# Patient Record
Sex: Female | Born: 1980 | Race: Black or African American | Hispanic: No | Marital: Single | Smoking: Never smoker
Health system: Southern US, Community
[De-identification: ages and names within clinical notes are randomized; demographics above are authoritative.]

## PROBLEM LIST (undated history)

## (undated) DIAGNOSIS — G35 Multiple sclerosis: Secondary | ICD-10-CM

## (undated) HISTORY — PX: TONSILLECTOMY: SUR1361

---

## 2002-07-28 ENCOUNTER — Emergency Department (HOSPITAL_COMMUNITY): Admission: EM | Admit: 2002-07-28 | Discharge: 2002-07-28 | Payer: Self-pay

## 2005-01-07 ENCOUNTER — Ambulatory Visit: Payer: Self-pay | Admitting: *Deleted

## 2005-01-11 ENCOUNTER — Ambulatory Visit: Payer: Self-pay | Admitting: Family Medicine

## 2005-01-11 ENCOUNTER — Inpatient Hospital Stay (HOSPITAL_COMMUNITY): Admission: AD | Admit: 2005-01-11 | Discharge: 2005-01-12 | Payer: Self-pay | Admitting: *Deleted

## 2005-01-14 ENCOUNTER — Inpatient Hospital Stay (HOSPITAL_COMMUNITY): Admission: AD | Admit: 2005-01-14 | Discharge: 2005-01-15 | Payer: Self-pay | Admitting: Obstetrics & Gynecology

## 2005-01-14 ENCOUNTER — Ambulatory Visit: Payer: Self-pay | Admitting: *Deleted

## 2005-01-17 ENCOUNTER — Ambulatory Visit: Payer: Self-pay | Admitting: *Deleted

## 2005-01-18 ENCOUNTER — Inpatient Hospital Stay (HOSPITAL_COMMUNITY): Admission: AD | Admit: 2005-01-18 | Discharge: 2005-01-20 | Payer: Self-pay | Admitting: Obstetrics & Gynecology

## 2005-01-18 ENCOUNTER — Ambulatory Visit: Payer: Self-pay | Admitting: Obstetrics and Gynecology

## 2005-03-25 ENCOUNTER — Other Ambulatory Visit: Admission: RE | Admit: 2005-03-25 | Discharge: 2005-03-25 | Payer: Self-pay | Admitting: Obstetrics and Gynecology

## 2005-08-18 ENCOUNTER — Emergency Department (HOSPITAL_COMMUNITY): Admission: EM | Admit: 2005-08-18 | Discharge: 2005-08-18 | Payer: Self-pay | Admitting: Emergency Medicine

## 2005-09-28 ENCOUNTER — Emergency Department (HOSPITAL_COMMUNITY): Admission: EM | Admit: 2005-09-28 | Discharge: 2005-09-28 | Payer: Self-pay | Admitting: Emergency Medicine

## 2007-02-15 ENCOUNTER — Other Ambulatory Visit: Admission: RE | Admit: 2007-02-15 | Discharge: 2007-02-15 | Payer: Self-pay | Admitting: Obstetrics and Gynecology

## 2007-07-02 ENCOUNTER — Emergency Department (HOSPITAL_COMMUNITY): Admission: EM | Admit: 2007-07-02 | Discharge: 2007-07-02 | Payer: Self-pay | Admitting: Emergency Medicine

## 2007-12-31 ENCOUNTER — Inpatient Hospital Stay (HOSPITAL_COMMUNITY): Admission: RE | Admit: 2007-12-31 | Discharge: 2008-01-03 | Payer: Self-pay | Admitting: *Deleted

## 2007-12-31 ENCOUNTER — Emergency Department (HOSPITAL_COMMUNITY): Admission: EM | Admit: 2007-12-31 | Discharge: 2007-12-31 | Payer: Self-pay | Admitting: Emergency Medicine

## 2007-12-31 ENCOUNTER — Ambulatory Visit: Payer: Self-pay | Admitting: *Deleted

## 2008-01-10 ENCOUNTER — Emergency Department (HOSPITAL_COMMUNITY): Admission: EM | Admit: 2008-01-10 | Discharge: 2008-01-10 | Payer: Self-pay | Admitting: Emergency Medicine

## 2008-07-21 ENCOUNTER — Emergency Department (HOSPITAL_COMMUNITY): Admission: EM | Admit: 2008-07-21 | Discharge: 2008-07-21 | Payer: Self-pay | Admitting: Emergency Medicine

## 2008-08-28 ENCOUNTER — Emergency Department (HOSPITAL_COMMUNITY): Admission: EM | Admit: 2008-08-28 | Discharge: 2008-08-28 | Payer: Self-pay | Admitting: Emergency Medicine

## 2010-08-06 LAB — CBC
HCT: 37.3 % (ref 36.0–46.0)
Hemoglobin: 12.7 g/dL (ref 12.0–15.0)
MCV: 95.1 fL (ref 78.0–100.0)
Platelets: 278 10*3/uL (ref 150–400)
RDW: 12.8 % (ref 11.5–15.5)

## 2010-08-06 LAB — URINALYSIS, ROUTINE W REFLEX MICROSCOPIC
Bilirubin Urine: NEGATIVE
Glucose, UA: NEGATIVE mg/dL
Ketones, ur: NEGATIVE mg/dL
Protein, ur: NEGATIVE mg/dL
Urobilinogen, UA: 1 mg/dL (ref 0.0–1.0)

## 2010-08-06 LAB — RPR: RPR Ser Ql: NONREACTIVE

## 2010-08-06 LAB — URINE MICROSCOPIC-ADD ON

## 2010-08-06 LAB — GC/CHLAMYDIA PROBE AMP, GENITAL
Chlamydia, DNA Probe: NEGATIVE
GC Probe Amp, Genital: NEGATIVE

## 2010-08-06 LAB — WET PREP, GENITAL
WBC, Wet Prep HPF POC: NONE SEEN
Yeast Wet Prep HPF POC: NONE SEEN

## 2010-08-06 LAB — POCT PREGNANCY, URINE: Preg Test, Ur: NEGATIVE

## 2010-08-08 LAB — GC/CHLAMYDIA PROBE AMP, GENITAL
Chlamydia, DNA Probe: NEGATIVE
GC Probe Amp, Genital: NEGATIVE

## 2010-08-08 LAB — URINALYSIS, ROUTINE W REFLEX MICROSCOPIC
Bilirubin Urine: NEGATIVE
Hgb urine dipstick: NEGATIVE
Ketones, ur: NEGATIVE mg/dL
Nitrite: NEGATIVE
Specific Gravity, Urine: 1.027 (ref 1.005–1.030)
Urobilinogen, UA: 1 mg/dL (ref 0.0–1.0)

## 2010-08-08 LAB — DIFFERENTIAL
Basophils Absolute: 0 10*3/uL (ref 0.0–0.1)
Basophils Relative: 0 % (ref 0–1)
Lymphocytes Relative: 21 % (ref 12–46)
Neutro Abs: 5.9 10*3/uL (ref 1.7–7.7)
Neutrophils Relative %: 71 % (ref 43–77)

## 2010-08-08 LAB — POCT I-STAT, CHEM 8
Calcium, Ion: 1.21 mmol/L (ref 1.12–1.32)
Creatinine, Ser: 0.9 mg/dL (ref 0.4–1.2)
Glucose, Bld: 80 mg/dL (ref 70–99)
Hemoglobin: 12.2 g/dL (ref 12.0–15.0)
TCO2: 24 mmol/L (ref 0–100)

## 2010-08-08 LAB — TYPE AND SCREEN
ABO/RH(D): B POS
Antibody Screen: NEGATIVE

## 2010-08-08 LAB — CBC
MCHC: 34 g/dL (ref 30.0–36.0)
RBC: 3.72 MIL/uL — ABNORMAL LOW (ref 3.87–5.11)
RDW: 12.8 % (ref 11.5–15.5)

## 2010-08-08 LAB — WET PREP, GENITAL
Trich, Wet Prep: NONE SEEN
Yeast Wet Prep HPF POC: NONE SEEN

## 2010-09-10 NOTE — H&P (Signed)
NAME:  Rita Caldwell, Rita Caldwell                    ACCOUNT NO.:  [id number]   MEDICAL RECORD NO.:  [id number]          PATIENT TYPE:  IPS   LOCATION:  0305                          FACILITY:  [facility]   PHYSICIAN:  Jasmine Pang, M.D. DATE OF BIRTH:  16-Jul-1980   DATE OF ADMISSION:  12/31/2007  DATE OF DISCHARGE:                       PSYCHIATRIC ADMISSION ASSESSMENT   This is a 30 year old married African American female who presented to  the emergency room yesterday reporting that she was depressed, that she  had been feeling suicidal.  She stated that she had tried to kill  herself by attempting to overdose on Unisom sleeping pills on September  3 and September 4.  She states that her intent was to kill herself.  She  reports that she feels hopeless and is having marital problems with her  spouse who threatened to leave her if she had her baby that she aborted  2 weeks ago.  She reports decreased sleep and appetite and she also  reports a history for undiagnosed postpartum depression in the past.  She did not take any medication after that.  This was from the birth of  her son who is now almost 3.  The patient reports that after the birth  of her son she became depressed.  They have been having marital problems  basically the whole time since their son has been born.  A couple of  months ago she rekindled an acquaintanceship with someone but she has  broken that off.  She and her husband tried to reconcile and she became  pregnant again.  Her husband stated that if she did not abort this fetus  he would leave.  He stated he just could not stand to be around her  after the baby was born and have another downward spiral.  The patient  reports that this is actually her second abortion for her husband.  The  first was when her son was 10 months ago.  She reports a lot of drama in  her household.  She would like to try to reconcile the marriage or if  that is not possible at this time at least work out a  reasonable  separation.  Apparently the lease on their apartment is up next month  and she states that if she could stay with uncles, grandparents or her  brothers in [place] if she cannot work out something with her  husband.   PAST PSYCHIATRIC HISTORY:  She stated she went to therapy for a few  weeks last November with someone named Robyn Haber.   SOCIAL HISTORY:  She has been married 5 years.  Her son is almost 3.  Her husband was diagnosed with sarcoidosis back in June. His family does  not feel that she works enough.   FAMILY HISTORY:  Negative.   ALCOHOL AND DRUG HISTORY:  Her UDS is positive for marijuana.  She  states she is only an occasional user and the last time was a few weeks  ago when they were in [place].   PRIMARY CARE PHYSICIAN:  Dr. Darcel Bayley.  She does not have any psychiatric  care.   MEDICAL PROBLEMS:  None are known.   MEDICATIONS:  None are prescribed.   DRUG ALLERGIES:  CODEINE and PENICILLIN.   PHYSICAL EXAMINATION:  She was medically cleared in the ED at [facility].  She had no remarkable findings.  Her vital signs on admission  show she is 67 inches tall, weighs 135.  Her temperature is 97.1, blood  pressure is 118/77 to 121/85.  Pulse is 72-76, respirations are 18.   MENTAL STATUS EXAM:  Today she is alert and oriented.  She was  appropriately groomed, dressed and nourished.  Her speech is not  pressured.  Her mood is appropriate to the situation.  Her affect is  within normal range.  Judgment and insight are intact.  Concentration  and memory intact.  Intelligence is at least average.  She denies being  suicidal.  She was never homicidal.  She denies auditory or visual  hallucinations.   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  AXIS II:  Rule out personality disorder.  AXIS III:  Unknown.  AXIS IV:  Severe, problems with primary support group, occupation and  Nurse, children's.  AXIS V:  35.   PLAN:  Admit for safety and  stabilization.  She will attend milieu  therapy, groups, etc.  She will be on the blue team.  We will have the  counselor set up a family session with her husband and whoever else she  feels is appropriate to help plan her discharge.  Estimated length of  stay is 3-5 days.      Mickie Leonarda Salon, P.A.-C.      Jasmine Pang, M.D.  Electronically Signed    MD/MEDQ  D:  01/01/2008  T:  01/02/2008  Job:  [id number]
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
## 2011-01-29 LAB — CBC
HCT: 35.9 — ABNORMAL LOW
MCHC: 34.4
MCV: 92.3
RBC: 3.89
WBC: 7

## 2011-01-29 LAB — TSH: TSH: 2.287

## 2011-01-29 LAB — RAPID URINE DRUG SCREEN, HOSP PERFORMED
Barbiturates: NOT DETECTED
Cocaine: NOT DETECTED

## 2011-01-29 LAB — DIFFERENTIAL
Basophils Absolute: 0
Lymphocytes Relative: 33
Lymphs Abs: 2.3
Neutro Abs: 4.4
Neutrophils Relative %: 62

## 2011-01-29 LAB — COMPREHENSIVE METABOLIC PANEL
AST: 26
BUN: 9
CO2: 25
Calcium: 9.5
Chloride: 107
Creatinine, Ser: 0.91
GFR calc Af Amer: >60
GFR calc non Af Amer: >60
Total Bilirubin: 0.7

## 2011-01-29 LAB — POCT I-STAT, CHEM 8
BUN: 10
Calcium, Ion: 1.19
Chloride: 106
HCT: 39
Potassium: 3.6
Sodium: 140

## 2011-01-29 LAB — SALICYLATE LEVEL: Salicylate Lvl: <4

## 2011-01-29 LAB — URINALYSIS, ROUTINE W REFLEX MICROSCOPIC
Glucose, UA: NEGATIVE
Ketones, ur: 40 — AB
Nitrite: NEGATIVE
Specific Gravity, Urine: 1.025
pH: 5.5

## 2012-08-02 ENCOUNTER — Emergency Department (HOSPITAL_COMMUNITY)
Admission: EM | Admit: 2012-08-02 | Discharge: 2012-08-02 | Disposition: A | Discharge status: Home / Self Care | Payer: Self-pay | Attending: Emergency Medicine | Admitting: Emergency Medicine

## 2012-08-02 ENCOUNTER — Encounter (HOSPITAL_COMMUNITY): Payer: Self-pay | Admitting: Emergency Medicine

## 2012-08-02 DIAGNOSIS — K0889 Other specified disorders of teeth and supporting structures: Secondary | ICD-10-CM

## 2012-08-02 DIAGNOSIS — K089 Disorder of teeth and supporting structures, unspecified: Secondary | ICD-10-CM | POA: Insufficient documentation

## 2012-08-02 DIAGNOSIS — K029 Dental caries, unspecified: Secondary | ICD-10-CM | POA: Insufficient documentation

## 2012-08-02 MED ORDER — OXYCODONE-ACETAMINOPHEN 5-325 MG PO TABS: 1.0000 | ORAL_TABLET | ORAL | Status: DC | PRN

## 2012-08-02 MED ORDER — AMOXICILLIN 500 MG PO CAPS: 500.0000 mg | ORAL_CAPSULE | Freq: Three times a day (TID) | ORAL | Status: DC

## 2012-08-02 NOTE — ED Notes (Signed)
MD at bedside. 

## 2012-08-02 NOTE — ED Provider Notes (Signed)
History     CSN: [id number]  Arrival date & time 08/02/12  1020   First MD Initiated Contact with Patient 08/02/12 1034      Chief Complaint  Patient presents with  . Dental Pain    (Consider location/radiation/quality/duration/timing/severity/associated sxs/prior treatment) HPI  Patient presents to the emergency department with a dental complaint. Symptoms began 4 days ago. The patient has tried to alleviate pain with hot pack, OTC pain medication.  Pain rated at a 10/10, characterized as throbbing in nature and located left lower molar. Patient denies fever, night sweats, chills, difficulty swallowing or opening mouth, SOB, nuchal rigidity or decreased ROM of neck.  Patient does not have a dentist and a referral to d/c.  History reviewed. No pertinent past medical history.  History reviewed. No pertinent past surgical history.  No family history on file.  History  Substance Use Topics  . Smoking status: Not on file  . Smokeless tobacco: Not on file  . Alcohol Use: Not on file    OB History   Grav Para Term Preterm Abortions TAB SAB Ect Mult Living                  Review of Systems  HENT: Positive for dental problem.   All other systems reviewed and are negative.    Allergies  Pineapple  Home Medications   Current Outpatient Rx  Name  Route  Sig  Dispense  Refill  . ibuprofen (ADVIL,MOTRIN) 200 MG tablet   Oral   Take 200 mg by mouth every 6 (six) hours as needed for pain.         Marland Kitchen amoxicillin (AMOXIL) 500 MG capsule   Oral   Take 1 capsule (500 mg total) by mouth 3 (three) times daily.   21 capsule   0   . oxyCODONE-acetaminophen (PERCOCET/ROXICET) 5-325 MG per tablet   Oral   Take 1 tablet by mouth every 4 (four) hours as needed for pain.   12 tablet   0     BP 122/73  Pulse 73  Temp(Src) 98.2 F (36.8 C) (Oral)  Resp 16  SpO2 98%  Physical Exam  Constitutional: She appears well-developed and well-nourished. No distress.  HENT:    Head: Normocephalic and atraumatic.  Mouth/Throat: Uvula is midline, oropharynx is clear and moist and mucous membranes are normal. Normal dentition. Dental caries (Pts tooth shows no obvious abscess but moderate to severe tenderness to palpation of marked tooth) present. No edematous.    Eyes: Pupils are equal, round, and reactive to light.  Neck: Trachea normal, normal range of motion and full passive range of motion without pain. Neck supple.  Cardiovascular: Normal rate, regular rhythm, normal heart sounds and normal pulses.   Pulmonary/Chest: Effort normal and breath sounds normal. No respiratory distress. Chest wall is not dull to percussion. She exhibits no tenderness, no crepitus, no edema, no deformity and no retraction.  Abdominal: Normal appearance.  Musculoskeletal: Normal range of motion.  Neurological: She is alert. She has normal strength.  Skin: Skin is warm, dry and intact. She is not diaphoretic.  Psychiatric: She has a normal mood and affect. Her speech is normal. Cognition and memory are normal.    ED Course  Procedures (including critical care time)  Labs Reviewed - No data to display No results found.   1. Toothache       MDM  Pt given Rx for pain and abx Patient given referral to dentist. Expressed that a dentist  must be seen or else this pain willo come back. I also told her that if she calls within 24-48 hours the dentist and her should be able to work out a payment plan and that she really needs to call.  Pt has been advised of the symptoms that warrant their return to the ED. Patient has voiced understanding and has agreed to follow-up with the PCP or specialist.          Dorthula Matas, PA-C 08/02/12 1111

## 2012-08-02 NOTE — ED Notes (Signed)
Lt side tooth pain since fri.

## 2012-08-04 NOTE — ED Provider Notes (Signed)
Medical screening examination/treatment/procedure(s) were performed by non-physician practitioner and as supervising physician I was immediately available for consultation/collaboration.   Balthazar Dooly E Connery Shiffler, MD 08/04/12 2150 

## 2013-12-23 ENCOUNTER — Encounter (HOSPITAL_COMMUNITY): Payer: Self-pay | Admitting: Emergency Medicine

## 2013-12-23 ENCOUNTER — Emergency Department (HOSPITAL_COMMUNITY)
Admission: EM | Admit: 2013-12-23 | Discharge: 2013-12-24 | Disposition: A | Discharge status: Home / Self Care | Payer: Self-pay | Attending: Emergency Medicine | Admitting: Emergency Medicine

## 2013-12-23 DIAGNOSIS — S239XXA Sprain of unspecified parts of thorax, initial encounter: Secondary | ICD-10-CM | POA: Insufficient documentation

## 2013-12-23 DIAGNOSIS — S199XXA Unspecified injury of neck, initial encounter: Secondary | ICD-10-CM

## 2013-12-23 DIAGNOSIS — M79671 Pain in right foot: Secondary | ICD-10-CM

## 2013-12-23 DIAGNOSIS — S8990XA Unspecified injury of unspecified lower leg, initial encounter: Secondary | ICD-10-CM | POA: Insufficient documentation

## 2013-12-23 DIAGNOSIS — S8001XA Contusion of right knee, initial encounter: Secondary | ICD-10-CM

## 2013-12-23 DIAGNOSIS — S0993XA Unspecified injury of face, initial encounter: Secondary | ICD-10-CM | POA: Insufficient documentation

## 2013-12-23 DIAGNOSIS — S8000XA Contusion of unspecified knee, initial encounter: Secondary | ICD-10-CM | POA: Insufficient documentation

## 2013-12-23 DIAGNOSIS — S99929A Unspecified injury of unspecified foot, initial encounter: Secondary | ICD-10-CM

## 2013-12-23 DIAGNOSIS — S99919A Unspecified injury of unspecified ankle, initial encounter: Secondary | ICD-10-CM

## 2013-12-23 DIAGNOSIS — Y9389 Activity, other specified: Secondary | ICD-10-CM | POA: Insufficient documentation

## 2013-12-23 DIAGNOSIS — M62838 Other muscle spasm: Secondary | ICD-10-CM | POA: Insufficient documentation

## 2013-12-23 DIAGNOSIS — S0990XA Unspecified injury of head, initial encounter: Secondary | ICD-10-CM | POA: Insufficient documentation

## 2013-12-23 DIAGNOSIS — Y92410 Unspecified street and highway as the place of occurrence of the external cause: Secondary | ICD-10-CM | POA: Insufficient documentation

## 2013-12-23 DIAGNOSIS — S161XXA Strain of muscle, fascia and tendon at neck level, initial encounter: Secondary | ICD-10-CM

## 2013-12-23 DIAGNOSIS — IMO0002 Reserved for concepts with insufficient information to code with codable children: Secondary | ICD-10-CM | POA: Insufficient documentation

## 2013-12-23 DIAGNOSIS — S29012A Strain of muscle and tendon of back wall of thorax, initial encounter: Secondary | ICD-10-CM
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
## 2013-12-24 MED ORDER — METHOCARBAMOL 500 MG PO TABS: 500.0000 mg | ORAL_TABLET | Freq: Two times a day (BID) | ORAL | Status: DC

## 2013-12-24 MED ORDER — HYDROCODONE-IBUPROFEN 7.5-200 MG PO TABS: 1.0000 | ORAL_TABLET | Freq: Four times a day (QID) | ORAL | Status: DC | PRN

## 2013-12-24 MED ORDER — OXYCODONE-ACETAMINOPHEN 5-325 MG PO TABS
2.0000 | ORAL_TABLET | Freq: Once | ORAL | Status: AC
  Administered 2013-12-24: 1 via ORAL
  Filled 2013-12-24: qty 2

## 2013-12-24 NOTE — ED Notes (Signed)
Pt ambulating independently w/ steady gait on d/c in no acute distress, A&Ox4. D/c instructions reviewed w/ pt and family - pt and family deny any further questions or concerns at present. Rx given x2  
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
## 2013-12-24 NOTE — ED Provider Notes (Signed)
Medical screening examination/treatment/procedure(s) were performed by non-physician practitioner and as supervising physician I was immediately available for consultation/collaboration.    Vida Roller, MD 12/24/13 [phone]

## 2017-02-25 DIAGNOSIS — G35 Multiple sclerosis: Secondary | ICD-10-CM

## 2017-02-25 DIAGNOSIS — G35D Multiple sclerosis, unspecified: Secondary | ICD-10-CM

## 2017-02-25 HISTORY — DX: Multiple sclerosis: G35

## 2017-02-25 HISTORY — DX: Multiple sclerosis, unspecified: G35.D

## 2017-03-11 ENCOUNTER — Encounter (HOSPITAL_COMMUNITY): Payer: Self-pay | Admitting: Emergency Medicine

## 2017-03-11 ENCOUNTER — Inpatient Hospital Stay (HOSPITAL_COMMUNITY)
Admission: EM | Admit: 2017-03-11 | Discharge: 2017-03-14 | DRG: 060 | Disposition: A | Discharge status: Home / Self Care | Payer: Self-pay | Attending: Family Medicine | Admitting: Family Medicine

## 2017-03-11 ENCOUNTER — Other Ambulatory Visit: Payer: Self-pay

## 2017-03-11 DIAGNOSIS — Z91018 Allergy to other foods: Secondary | ICD-10-CM

## 2017-03-11 DIAGNOSIS — G35 Multiple sclerosis: Principal | ICD-10-CM | POA: Diagnosis present

## 2017-03-11 DIAGNOSIS — G35A Relapsing-remitting multiple sclerosis: Secondary | ICD-10-CM | POA: Diagnosis present

## 2017-03-11 DIAGNOSIS — R32 Unspecified urinary incontinence: Secondary | ICD-10-CM | POA: Diagnosis present

## 2017-03-11 DIAGNOSIS — R202 Paresthesia of skin: Secondary | ICD-10-CM | POA: Diagnosis present

## 2017-03-11 DIAGNOSIS — Z91041 Radiographic dye allergy status: Secondary | ICD-10-CM

## 2017-03-11 HISTORY — DX: Multiple sclerosis: G35

## 2017-03-11 LAB — I-STAT CHEM 8, ED
BUN: 8 mg/dL (ref 6–20)
CALCIUM ION: 1.14 mmol/L — AB (ref 1.15–1.40)
CHLORIDE: 105 mmol/L (ref 101–111)
CREATININE: 0.9 mg/dL (ref 0.44–1.00)
GLUCOSE: 103 mg/dL — AB (ref 65–99)
HCT: 40 % (ref 36.0–46.0)
Hemoglobin: 13.6 g/dL (ref 12.0–15.0)
POTASSIUM: 3.6 mmol/L (ref 3.5–5.1)
Sodium: 138 mmol/L (ref 135–145)
TCO2: 23 mmol/L (ref 22–32)

## 2017-03-11 LAB — URINALYSIS, ROUTINE W REFLEX MICROSCOPIC
BILIRUBIN URINE: NEGATIVE
Glucose, UA: NEGATIVE mg/dL
Hgb urine dipstick: NEGATIVE
Ketones, ur: NEGATIVE mg/dL
Leukocytes, UA: NEGATIVE
NITRITE: NEGATIVE
PH: 7 (ref 5.0–8.0)
Protein, ur: NEGATIVE mg/dL
SPECIFIC GRAVITY, URINE: 1.019 (ref 1.005–1.030)

## 2017-03-11 LAB — CBC
HEMATOCRIT: 39.2 % (ref 36.0–46.0)
Hemoglobin: 13.3 g/dL (ref 12.0–15.0)
MCH: 30.6 pg (ref 26.0–34.0)
MCHC: 33.9 g/dL (ref 30.0–36.0)
MCV: 90.1 fL (ref 78.0–100.0)
Platelets: 300 10*3/uL (ref 150–400)
RBC: 4.35 MIL/uL (ref 3.87–5.11)
RDW: 12.2 % (ref 11.5–15.5)
WBC: 8.2 10*3/uL (ref 4.0–10.5)

## 2017-03-11 LAB — PREGNANCY, URINE: Preg Test, Ur: NEGATIVE

## 2017-03-11 MED ORDER — SODIUM CHLORIDE 0.9% FLUSH
3.0000 mL | INTRAVENOUS | Status: DC | PRN
Start: 2017-03-11 — End: 2017-03-14

## 2017-03-11 MED ORDER — SODIUM CHLORIDE 0.9 % IV SOLN: 250.0000 mL | INTRAVENOUS | Status: DC | PRN

## 2017-03-11 MED ORDER — SODIUM CHLORIDE 0.9% FLUSH
3.0000 mL | Freq: Two times a day (BID) | INTRAVENOUS | Status: DC
  Administered 2017-03-11 – 2017-03-13 (×4): 3 mL via INTRAVENOUS

## 2017-03-11 MED ORDER — SODIUM CHLORIDE 0.9 % IV SOLN
1000.0000 mg | Freq: Once | INTRAVENOUS | Status: DC
  Administered 2017-03-11: 1000 mg via INTRAVENOUS
  Filled 2017-03-11: qty 8

## 2017-03-11 MED ORDER — NAPROXEN 250 MG PO TABS
500.0000 mg | ORAL_TABLET | Freq: Two times a day (BID) | ORAL | Status: DC
  Administered 2017-03-12 – 2017-03-13 (×3): 500 mg via ORAL
  Filled 2017-03-11 (×4): qty 2

## 2017-03-11 MED ORDER — METHOCARBAMOL 500 MG PO TABS
750.0000 mg | ORAL_TABLET | Freq: Four times a day (QID) | ORAL | Status: DC | PRN
  Administered 2017-03-14: 750 mg via ORAL
  Filled 2017-03-11 (×2): qty 2

## 2017-03-11 MED ORDER — HYDROCODONE-ACETAMINOPHEN 5-325 MG PO TABS
1.0000 | ORAL_TABLET | Freq: Four times a day (QID) | ORAL | Status: DC | PRN
  Administered 2017-03-12: 1 via ORAL
  Filled 2017-03-11: qty 1

## 2017-03-11 NOTE — ED Notes (Signed)
Phone report called to [employer], Charity fundraiser.
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
## 2017-03-12 LAB — CBC
HCT: 41.5 % (ref 36.0–46.0)
HEMOGLOBIN: 14.3 g/dL (ref 12.0–15.0)
MCH: 31.2 pg (ref 26.0–34.0)
MCHC: 34.5 g/dL (ref 30.0–36.0)
MCV: 90.4 fL (ref 78.0–100.0)
Platelets: 307 10*3/uL (ref 150–400)
RBC: 4.59 MIL/uL (ref 3.87–5.11)
RDW: 12 % (ref 11.5–15.5)
WBC: 5.7 10*3/uL (ref 4.0–10.5)

## 2017-03-12 LAB — BASIC METABOLIC PANEL
ANION GAP: 9 (ref 5–15)
BUN: 12 mg/dL (ref 6–20)
CALCIUM: 9.9 mg/dL (ref 8.9–10.3)
CHLORIDE: 104 mmol/L (ref 101–111)
CO2: 23 mmol/L (ref 22–32)
CREATININE: 1.06 mg/dL — AB (ref 0.44–1.00)
GFR calc non Af Amer: >60 mL/min (ref >60)
GLUCOSE: 168 mg/dL — AB (ref 65–99)
Potassium: 4.1 mmol/L (ref 3.5–5.1)
Sodium: 136 mmol/L (ref 135–145)

## 2017-03-12 LAB — HIV ANTIBODY (ROUTINE TESTING W REFLEX): HIV Screen 4th Generation wRfx: NONREACTIVE

## 2017-03-12 MED ORDER — PANTOPRAZOLE SODIUM 40 MG PO TBEC
40.0000 mg | DELAYED_RELEASE_TABLET | Freq: Every day | ORAL | Status: DC
  Administered 2017-03-12 – 2017-03-14 (×3): 40 mg via ORAL
  Filled 2017-03-12 (×3): qty 1

## 2017-03-12 MED ORDER — DIPHENHYDRAMINE HCL 50 MG PO CAPS: 50.0000 mg | ORAL_CAPSULE | Freq: Four times a day (QID) | ORAL | Status: DC | PRN

## 2017-03-12 MED ORDER — DIPHENHYDRAMINE HCL 50 MG/ML IJ SOLN: 50.0000 mg | Freq: Four times a day (QID) | INTRAMUSCULAR | Status: DC | PRN

## 2017-03-12 MED ORDER — DIPHENHYDRAMINE HCL 50 MG/ML IJ SOLN: 25.0000 mg | Freq: Four times a day (QID) | INTRAMUSCULAR | Status: DC | PRN

## 2017-03-12 MED ORDER — METHYLPREDNISOLONE SODIUM SUCC 1000 MG IJ SOLR
1000.0000 mg | INTRAMUSCULAR | Status: DC
  Administered 2017-03-12 – 2017-03-13 (×2): 1000 mg via INTRAVENOUS
  Filled 2017-03-12 (×2): qty 8

## 2017-03-12 NOTE — Progress Notes (Signed)
Subjective: Currently the patient still has mild subjective tingling in the upper extremities.  She states that she still is having some incontinence but at times able to make it to the bathroom on time.  She had questions of how long she would be in the hospital.  I explained to her best case scenario 3 days most likely scenario 5 days.  She will need follow-up with an outpatient neurologist on discharge.  As she states that she had a follow-up with an outpatient doctor that was linked with her chiropractor however I believe a neurologist would be the best option.  Exam: Vitals:   03/11/17 2130 03/12/17 0523  BP: 119/62 120/64  Pulse: 76 81  Resp:  18  Temp: 98.7 F (37.1 C) 98.1 F (36.7 C)  SpO2: 98% 97%    HEENT-  Normocephalic, no lesions, without obvious abnormality.  Normal external eye and conjunctiva.  Normal TM's bilaterally.  Normal auditory canals and external ears. Normal external nose, mucus membranes and septum.  Normal pharynx. Cardiovascular- S1, S2 normal, pulses palpable throughout   Lungs- chest clear, no wheezing, rales, normal symmetric air entry Abdomen- normal findings: bowel sounds normal   Neuro:  CN: Pupils are equal and round. They are symmetrically reactive from 3-->2 mm. EOMI without nystagmus. Facial sensation is intact to light touch. Face is symmetric at rest with normal strength and mobility. Hearing is intact to conversational voice. Palate elevates symmetrically and uvula is midline. Voice is normal in tone, pitch and quality. Bilateral SCM and trapezii are 5/5. Tongue is midline with normal bulk and mobility.  Motor: Normal bulk, tone, and strength. 5/5 throughout. No drift.  Sensation: Intact to light touch. --Subjective tingling in bilateral arms DTRs: 2+, symmetric  Toes downgoing bilaterally. No pathologic reflexes.  Coordination: Finger-to-nose and heel-to-shin are without dysmetria   Medications:  Scheduled: . naproxen  500 mg Oral BID WC  .  sodium chloride flush  3 mL Intravenous Q12H   Continuous: . sodium chloride    . methylPREDNISolone (SOLU-MEDROL) injection     JSR:PRXYVO chloride, HYDROcodone-acetaminophen, methocarbamol, sodium chloride flush  Pertinent Labs/Diagnostics:   No results found.   Felicie Morn PA-C [facility] [phone]  Impression: 36 year old female with new diagnosis of multiple sclerosis receiving IV solumedrol.    Recommendations: 1) continue Solu-Medrol for a total of 3-5 doses 2) No need to repeat imaging already done, but will get baseline MRI T-Spine 3) will need outpatient follow up.   Ritta Slot, MD [facility] [phone]  If 7pm- 7am, please page neurology on call as listed in AMION.   03/12/2017, 8:50 AM

## 2017-03-12 NOTE — Care Management Note (Signed)
Case Management Note  Patient Details  Name: Rita Caldwell MRN: [id number] Date of Birth: May 28, 1980  Subjective/Objective:                  Neuro genic bladder and recent dx of ms  Action/Plan: Date: March 12, 2017 Marcelle Smiling, BSN, [place], [place]  [phone] Chart and notes review for patient progress and needs. Will follow for case management and discharge needs. Next review date: [id number]  Expected Discharge Date:  (unknown)               Expected Discharge Plan:  Home/Self Care  In-House Referral:     Discharge planning Services  CM Consult  Post Acute Care Choice:    Choice offered to:     DME Arranged:    DME Agency:     HH Arranged:    HH Agency:     Status of Service:  In process, will continue to follow  If discussed at Long Length of Stay Meetings, dates discussed:    Additional Comments:  Golda Acre, RN 03/12/2017, 8:33 AM
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
## 2017-03-13 ENCOUNTER — Inpatient Hospital Stay (HOSPITAL_COMMUNITY): Payer: Self-pay

## 2017-03-13 MED ORDER — DIPHENHYDRAMINE HCL 50 MG/ML IJ SOLN
50.0000 mg | Freq: Four times a day (QID) | INTRAMUSCULAR | Status: DC | PRN
  Administered 2017-03-13: 50 mg via INTRAVENOUS
  Filled 2017-03-13 (×2): qty 1

## 2017-03-13 MED ORDER — GADOBENATE DIMEGLUMINE 529 MG/ML IV SOLN
20.0000 mL | Freq: Once | INTRAVENOUS | Status: AC | PRN
  Administered 2017-03-13: 18 mL via INTRAVENOUS

## 2017-03-13 MED ORDER — KETOROLAC TROMETHAMINE 30 MG/ML IJ SOLN
30.0000 mg | Freq: Once | INTRAMUSCULAR | Status: AC
  Administered 2017-03-13: 30 mg via INTRAVENOUS
  Filled 2017-03-13: qty 1
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
## 2017-03-13 NOTE — Progress Notes (Signed)
Subjective: Patient states that the tingling in her arms has now dissipated and only in her hands.  Her bladder issues have improved.  Today is her last day of Solu-Medrol and she states that she is getting slightly jittery.  She is scheduled for her MRI at 2:00.  Exam: Vitals:   03/12/17 1347 03/13/17 0540  BP: 120/70 119/65  Pulse: 95 87  Resp: 18 17  Temp: 97.9 F (36.6 C) 98 F (36.7 C)  SpO2: 99% 99%    HEENT-  Normocephalic, no lesions, without obvious abnormality.  Normal external eye and conjunctiva.  Normal TM's bilaterally.  Normal auditory canals and external ears. Normal external nose, mucus membranes and septum.  Normal pharynx. Cardiovascular- S1, S2 normal, pulses palpable throughout   Lungs- chest clear, no wheezing, rales, normal symmetric air entry, Heart exam - S1, S2 normal, no murmur, no gallop, rate regular Abdomen- normal findings: bowel sounds normal   Neuro:  CN: Pupils are equal and round. They are symmetrically reactive from 3-->2 mm. EOMI without nystagmus. Facial sensation is intact to light touch. Face is symmetric at rest with normal strength and mobility. Hearing is intact to conversational voice. Palate elevates symmetrically and uvula is midline. Voice is normal in tone, pitch and quality. Bilateral SCM and trapezii are 5/5. Tongue is midline with normal bulk and mobility.  Motor: Normal bulk, tone, and strength. 5/5 throughout. No drift.  Sensation: Intact to light touch.  DTRs: 2+, symmetric  Toes downgoing bilaterally. No pathologic reflexes.  Coordination: Finger-to-nose and heel-to-shin are without dysmetria   Medications:  Scheduled: . naproxen  500 mg Oral BID WC  . pantoprazole  40 mg Oral Daily  . sodium chloride flush  3 mL Intravenous Q12H   Continuous: . sodium chloride    . methylPREDNISolone (SOLU-MEDROL) injection Stopped (03/12/17 2130)    Pertinent Labs/Diagnostics: Thoracic spine MRI pending  No results found.   Felicie Morn PA-C [facility] [phone]  Impression: 36 year old female with new diagnosis of multiple sclerosis receiving IV solumedrol.    Recommendations: 1) continue Solu-Medrol for last dose today after MRI 2)  MRI of T-spine today to be done at 1400 hrs. after that she can have her last dose of Solu-Medrol. 3) will need outpatient follow up.   Discussed with primary about the above  At this time will sign off      03/13/2017, 9:11 AM

## 2017-03-14 ENCOUNTER — Inpatient Hospital Stay (HOSPITAL_COMMUNITY): Payer: Self-pay

## 2017-03-14 LAB — GLUCOSE, CAPILLARY: GLUCOSE-CAPILLARY: 134 mg/dL — AB (ref 65–99)

## 2017-03-14 MED ORDER — METHYLPREDNISOLONE SODIUM SUCC 1000 MG IJ SOLR
1000.0000 mg | Freq: Once | INTRAMUSCULAR | Status: AC
  Administered 2017-03-14: 1000 mg via INTRAVENOUS
  Filled 2017-03-14: qty 8
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
## 2019-11-11 IMAGING — MR MR THORACIC SPINE WO/W CM
4 of 9 series · 19 of 48 positions shown · IV contrast (Yes)
Comparison: None.

CLINICAL DATA: Recent diagnosis of multiple sclerosis. Evaluate for
thoracic lesion.

EXAM:
MRI THORACIC WITHOUT AND WITH CONTRAST
TECHNIQUE: Multiplanar and multiecho pulse sequences of the thoracic spine were
obtained without and with intravenous contrast.
CONTRAST:  18mL MULTIHANCE GADOBENATE DIMEGLUMINE 529 MG/ML IV SOLN

[Series 5: T1 · sagittal · 3.0mm · 0.66mm/px · 2 of 13 slices shown (1 of 2)]
[im 1/13]
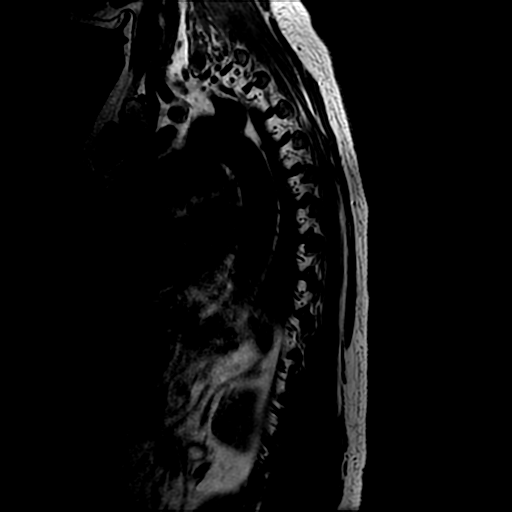
[im 13/13]
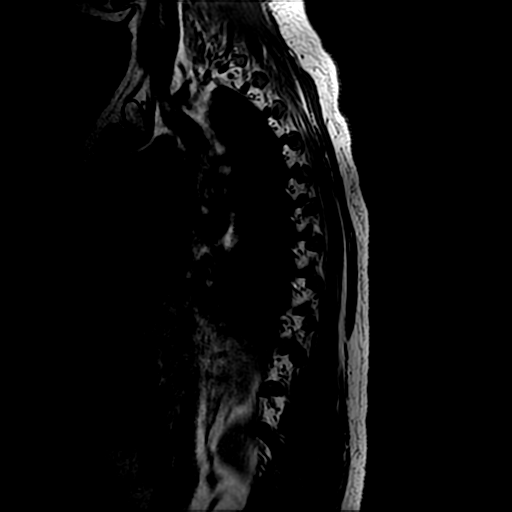

[Series 7: T2 · axial · 4.0mm · 0.39mm/px · z∈[-314,-100]mm · 9 of 39 slices shown]
[im 1/39]
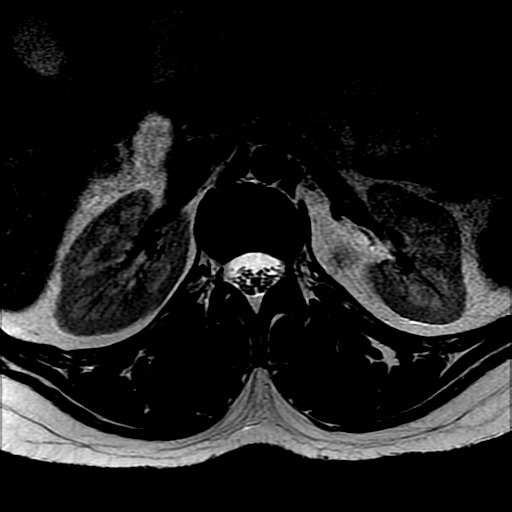
[im 5/39]
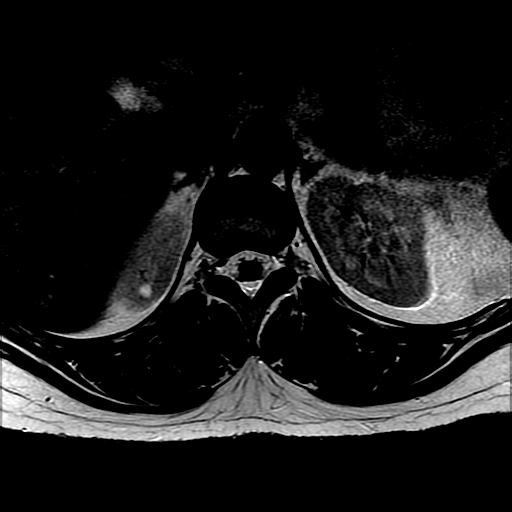
[im 10/39]
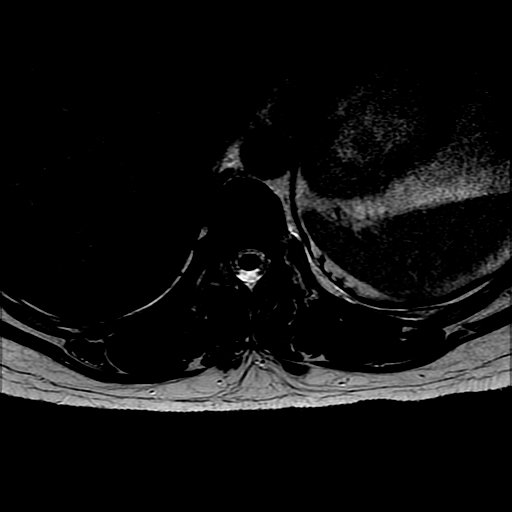
[im 15/39]
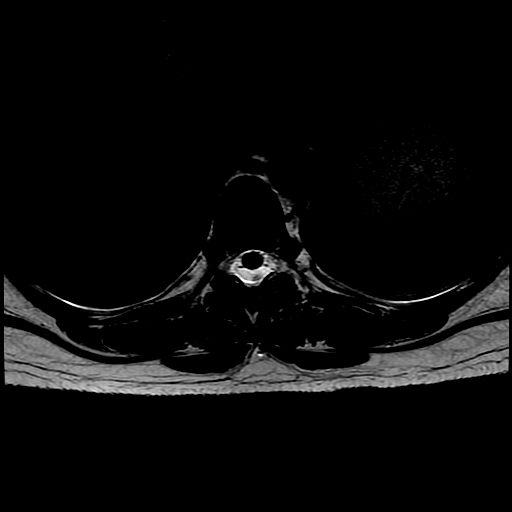
[im 20/39]
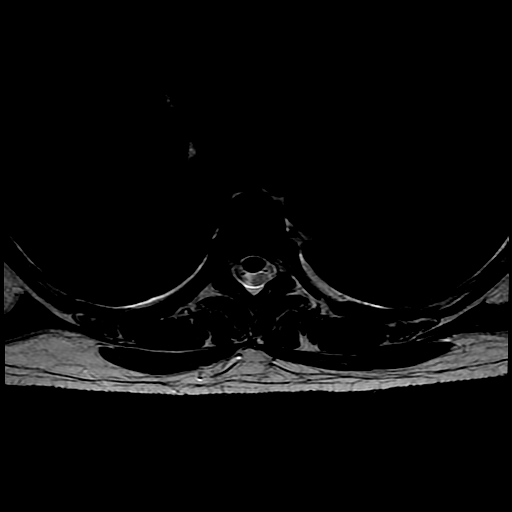
[im 24/39]
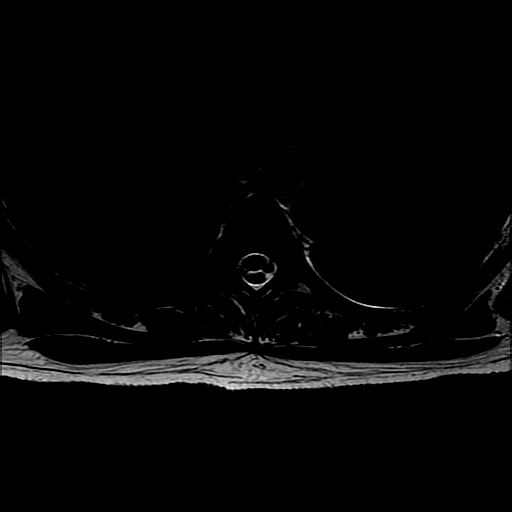
[im 29/39]
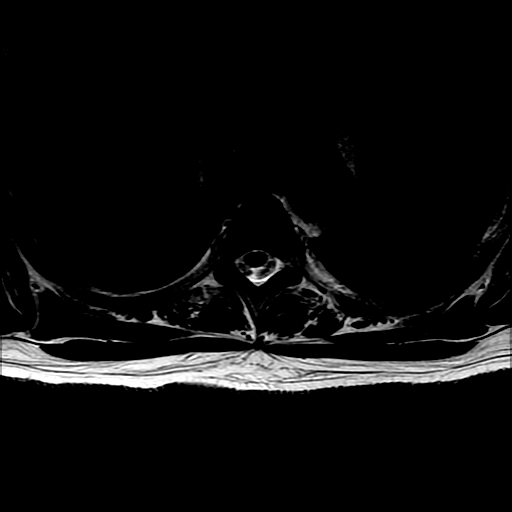
[im 34/39]
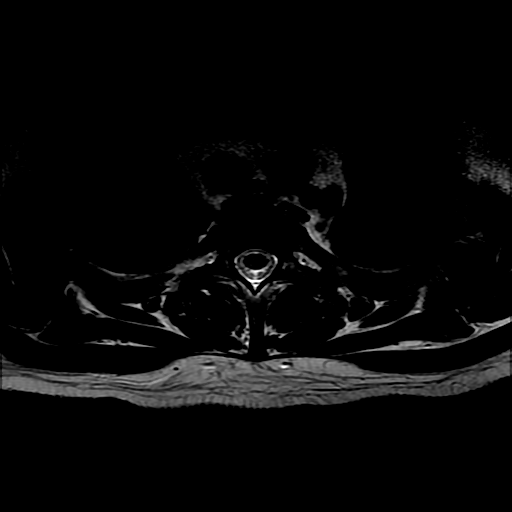
[im 39/39]
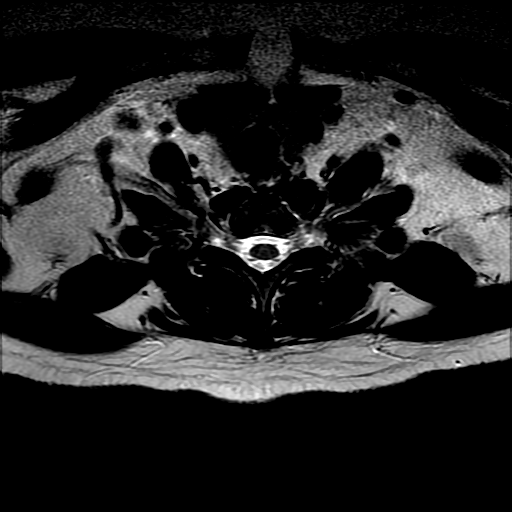

[Series 9: T1 · axial · non-contrast · 4.0mm · 0.39mm/px · z∈[-314,-126]mm · 5 of 39 slices shown (2 of 2)]
[im 1/39]
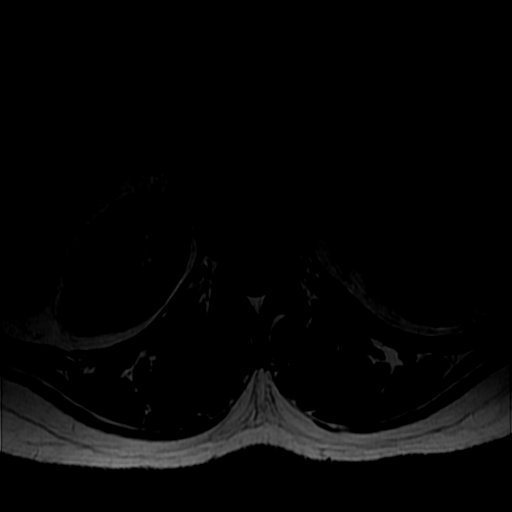
[im 5/39]
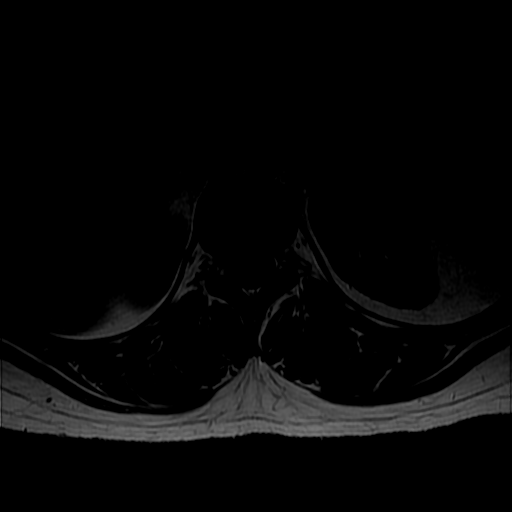
[im 10/39]
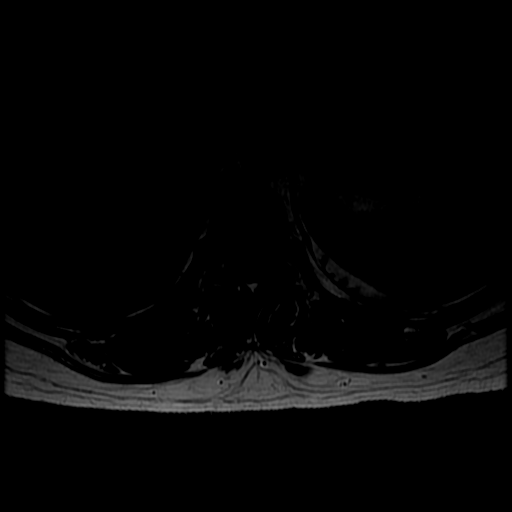
[im 20/39]
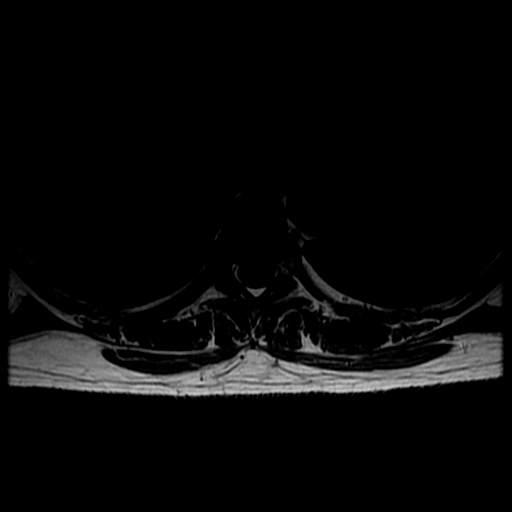
[im 34/39]
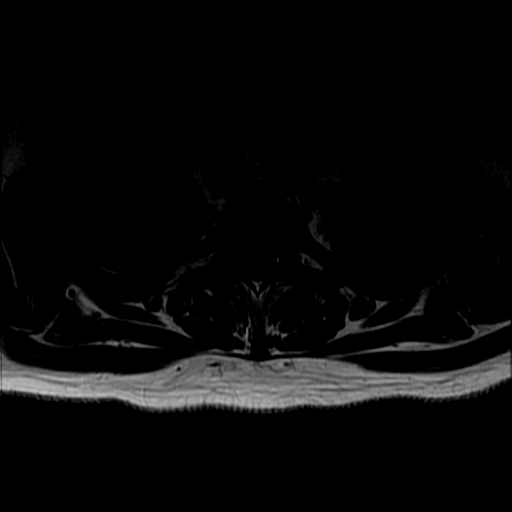

[Series 10: T2 post-contrast · sagittal · 3.0mm · 0.66mm/px · 3 of 13 slices shown]
[im 1/13]
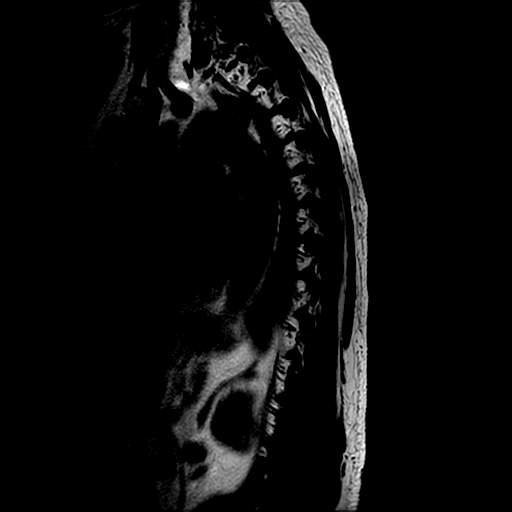
[im 7/13]
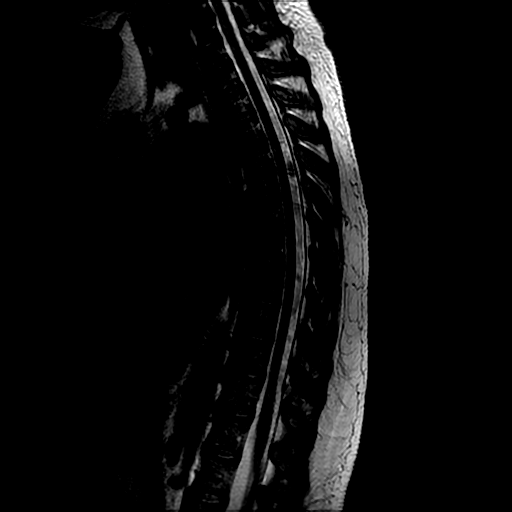
[im 13/13]
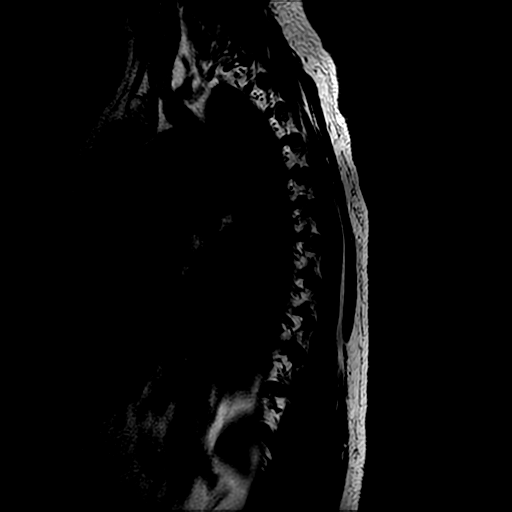

[19 of 48 positions shown; findings below may reference images not displayed]

FINDINGS: MRI THORACIC SPINE FINDINGS

Alignment:  Normal

Vertebrae: No fracture, evidence of discitis, or bone lesion.

Cord:  Normal signal and morphology.  No atrophy or displacement.

Paraspinal and other soft tissues: Tiny cyst in the upper pole right
kidney.

Disc levels:

No degenerative change or impingement.
IMPRESSION: Negative exam.  Normal appearance of the thoracic cord.

## 2022-05-07 ENCOUNTER — Ambulatory Visit: Payer: Self-pay | Admitting: Family Medicine

## 2022-05-07 ENCOUNTER — Telehealth: Payer: Self-pay | Admitting: General Practice

## 2022-05-07 NOTE — Telephone Encounter (Signed)
Pt was a no show for a NP app with Dr. Grandville Silos on 05/07/22, I sent a no show letter.

## 2022-11-13 ENCOUNTER — Encounter (HOSPITAL_BASED_OUTPATIENT_CLINIC_OR_DEPARTMENT_OTHER): Payer: Self-pay

## 2022-11-13 ENCOUNTER — Emergency Department (HOSPITAL_BASED_OUTPATIENT_CLINIC_OR_DEPARTMENT_OTHER): Payer: 59

## 2022-11-13 ENCOUNTER — Emergency Department (HOSPITAL_COMMUNITY): Payer: 59

## 2022-11-13 ENCOUNTER — Other Ambulatory Visit: Payer: Self-pay

## 2022-11-13 ENCOUNTER — Ambulatory Visit
Admission: EM | Admit: 2022-11-13 | Discharge: 2022-11-13 | Disposition: A | Discharge status: Home / Self Care | Payer: 59 | Attending: Internal Medicine | Admitting: Internal Medicine

## 2022-11-13 ENCOUNTER — Inpatient Hospital Stay (HOSPITAL_BASED_OUTPATIENT_CLINIC_OR_DEPARTMENT_OTHER)
Admission: EM | Admit: 2022-11-13 | Discharge: 2022-11-15 | DRG: 060 | Disposition: A | Discharge status: Home / Self Care | Payer: 59 | Attending: Family Medicine | Admitting: Family Medicine

## 2022-11-13 DIAGNOSIS — H539 Unspecified visual disturbance: Secondary | ICD-10-CM

## 2022-11-13 DIAGNOSIS — R519 Headache, unspecified: Secondary | ICD-10-CM | POA: Diagnosis not present

## 2022-11-13 DIAGNOSIS — Z820 Family history of epilepsy and other diseases of the nervous system: Secondary | ICD-10-CM

## 2022-11-13 DIAGNOSIS — Z91041 Radiographic dye allergy status: Secondary | ICD-10-CM

## 2022-11-13 DIAGNOSIS — G360 Neuromyelitis optica [Devic]: Secondary | ICD-10-CM | POA: Diagnosis not present

## 2022-11-13 DIAGNOSIS — H5462 Unqualified visual loss, left eye, normal vision right eye: Secondary | ICD-10-CM | POA: Diagnosis present

## 2022-11-13 DIAGNOSIS — Z758 Other problems related to medical facilities and other health care: Secondary | ICD-10-CM

## 2022-11-13 DIAGNOSIS — Z87891 Personal history of nicotine dependence: Secondary | ICD-10-CM

## 2022-11-13 DIAGNOSIS — G35 Multiple sclerosis: Secondary | ICD-10-CM | POA: Diagnosis present

## 2022-11-13 DIAGNOSIS — R202 Paresthesia of skin: Secondary | ICD-10-CM

## 2022-11-13 DIAGNOSIS — Z888 Allergy status to other drugs, medicaments and biological substances status: Secondary | ICD-10-CM

## 2022-11-13 DIAGNOSIS — G379 Demyelinating disease of central nervous system, unspecified: Secondary | ICD-10-CM | POA: Diagnosis not present

## 2022-11-13 DIAGNOSIS — Z880 Allergy status to penicillin: Secondary | ICD-10-CM

## 2022-11-13 DIAGNOSIS — Z9109 Other allergy status, other than to drugs and biological substances: Secondary | ICD-10-CM

## 2022-11-13 LAB — CBC WITH DIFFERENTIAL/PLATELET
Abs Immature Granulocytes: 0.01 10*3/uL (ref 0.00–0.07)
Basophils Absolute: 0 10*3/uL (ref 0.0–0.1)
Basophils Relative: 0 %
Eosinophils Absolute: 0 10*3/uL (ref 0.0–0.5)
Eosinophils Relative: 1 %
HCT: 41.5 % (ref 36.0–46.0)
Hemoglobin: 13.6 g/dL (ref 12.0–15.0)
Immature Granulocytes: 0 %
Lymphocytes Relative: 46 %
Lymphs Abs: 3.1 10*3/uL (ref 0.7–4.0)
MCH: 29.8 pg (ref 26.0–34.0)
MCHC: 32.8 g/dL (ref 30.0–36.0)
MCV: 91 fL (ref 80.0–100.0)
Monocytes Absolute: 0.5 10*3/uL (ref 0.1–1.0)
Monocytes Relative: 7 %
Neutro Abs: 3 10*3/uL (ref 1.7–7.7)
Neutrophils Relative %: 46 %
Platelets: 360 10*3/uL (ref 150–400)
RBC: 4.56 MIL/uL (ref 3.87–5.11)
RDW: 12.9 % (ref 11.5–15.5)
WBC: 6.6 10*3/uL (ref 4.0–10.5)
nRBC: 0 % (ref 0.0–0.2)

## 2022-11-13 LAB — BASIC METABOLIC PANEL
Anion gap: 10 (ref 5–15)
BUN: 10 mg/dL (ref 6–20)
CO2: 23 mmol/L (ref 22–32)
Calcium: 9.8 mg/dL (ref 8.9–10.3)
Chloride: 103 mmol/L (ref 98–111)
Creatinine, Ser: 0.97 mg/dL (ref 0.44–1.00)
GFR, Estimated: >60 mL/min (ref >60)
Glucose, Bld: 101 mg/dL — ABNORMAL HIGH (ref 70–99)
Potassium: 3.6 mmol/L (ref 3.5–5.1)
Sodium: 136 mmol/L (ref 135–145)

## 2022-11-13 MED ORDER — SODIUM CHLORIDE 0.9 % IV SOLN
1000.0000 mg | Freq: Every day | INTRAVENOUS | Status: DC
  Administered 2022-11-14: 1000 mg via INTRAVENOUS
  Filled 2022-11-13 (×2): qty 16

## 2022-11-13 NOTE — ED Provider Notes (Addendum)
[facility]    CSN: [id number] Arrival date & time: 11/13/22  0956      History   Chief Complaint Chief Complaint  Patient presents with   Eye Problem    HPI Rita Caldwell is a 42 y.o. female presents for evaluation of a headache.  Patient has a past medical history of multiple sclerosis with last known flare last year.  She reports yesterday at work she stood up from her desk to do something and had sudden onset/thunderclap headache that has been persistent since that time.  It is unilateral on the left side behind her left eye and she rated as a 10 out of 10 at that moment and reports it was the worst headache of her life.  She currently rates it as a 7 out of 10.  She reports it is associated with blurry vision in her left eye as well as muted colors in the left eye.  She is also having flashing light sensation.  No nausea/vomiting, dizziness, syncope.  No history of migraines or family history of migraines.  Denies first-degree relative with history of SAH.  She is not on blood thinning medications.  Denies history of headaches in general.  She took some BC powder with no improvement.  No other concerns at this time   Eye Problem Associated symptoms: headaches and photophobia     Past Medical History:  Diagnosis Date   MS (multiple sclerosis) (HCC) 02/25/2017    Patient Active Problem List   Diagnosis Date Noted   Multiple sclerosis (HCC) 03/11/2017   Tingling of both upper extremities 03/11/2017    Past Surgical History:  Procedure Laterality Date   TONSILLECTOMY     age 71    OB History   No obstetric history on file.      Home Medications    Prior to Admission medications   Medication Sig Start Date End Date Taking? Authorizing Provider  HYDROcodone-acetaminophen (NORCO/VICODIN) 5-325 MG tablet Take 1 tablet every 6 (six) hours as needed by mouth for moderate pain or severe pain.    [provider]  methocarbamol (ROBAXIN) 750 MG tablet Take  750 mg every 6 (six) hours as needed by mouth for muscle spasms.    [provider]    Family History History reviewed. No pertinent family history.  Social History Social History   Tobacco Use   Smoking status: Never   Smokeless tobacco: Never  Vaping Use   Vaping status: Never Used  Substance Use Topics   Alcohol use: Yes    Comment: occ   Drug use: No     Allergies   Contrast media [iodinated contrast media] and Pineapple   Review of Systems Review of Systems  Eyes:  Positive for photophobia and visual disturbance.  Neurological:  Positive for headaches.     Physical Exam Triage Vital Signs ED Triage Vitals  Encounter Vitals Group     BP 11/13/22 1022 (!) 164/81     Systolic BP Percentile --      Diastolic BP Percentile --      Pulse Rate 11/13/22 1021 64     Resp 11/13/22 1021 18     Temp 11/13/22 1021 98.3 F (36.8 C)     Temp Source 11/13/22 1021 Oral     SpO2 11/13/22 1021 98 %     Weight --      Height --      Head Circumference --      Peak  Flow --      Pain Score 11/13/22 1021 8     Pain Loc --      Pain Education --      Exclude from Growth Chart --    No data found.  Updated Vital Signs BP (!) 164/81   Pulse 64   Temp 98.3 F (36.8 C) (Oral)   Resp 18   LMP 10/29/2022 (Exact Date)   SpO2 98%   Visual Acuity Right Eye Distance: 20/15 Left Eye Distance: 20/20 Bilateral Distance: 20/15  Right Eye Near:   Left Eye Near:    Bilateral Near:     Physical Exam Vitals and nursing note reviewed.  Constitutional:      General: She is not in acute distress.    Appearance: Normal appearance. She is not ill-appearing.  HENT:     Head: Normocephalic and atraumatic.     Right Ear: Tympanic membrane and ear canal normal.     Left Ear: Tympanic membrane and ear canal normal.  Eyes:     Extraocular Movements: Extraocular movements intact.     Conjunctiva/sclera: Conjunctivae normal.     Pupils: Pupils are equal, round, and  reactive to light.  Cardiovascular:     Rate and Rhythm: Normal rate.  Pulmonary:     Effort: Pulmonary effort is normal.  Skin:    General: Skin is warm and dry.  Neurological:     General: No focal deficit present.     Mental Status: She is alert and oriented to person, place, and time.     GCS: GCS eye subscore is 4. GCS verbal subscore is 5. GCS motor subscore is 6.     Cranial Nerves: No facial asymmetry.     Motor: No weakness or pronator drift.     Coordination: Romberg sign negative.  Psychiatric:        Mood and Affect: Mood normal.        Behavior: Behavior normal.      UC Treatments / Results  Labs (all labs ordered are listed, but only abnormal results are displayed) Labs Reviewed - No data to display  EKG   Radiology No results found.  Procedures Procedures (including critical care time)  Medications Ordered in UC Medications - No data to display  Initial Impression / Assessment and Plan / UC Course  I have reviewed the triage vital signs and the nursing notes.  Pertinent labs & imaging results that were available during my care of the patient were reviewed by me and considered in my medical decision making (see chart for details).     Viewed symptoms and exam with patient.  Patient presenting with thunderclap type headache that onset was yesterday with persistent headache, visual changes.  Given the presentation advise she go to the emergency room for further evaluation and workup of her symptoms.  She is in agreement with plan and will go POV to the emergency room.  She was instructed to pull over and call 911 for any worsening symptoms that occur in transit and she verbalized understanding. Final Clinical Impressions(s) / UC Diagnoses   Final diagnoses:  Sudden onset unilateral headache     Discharge Instructions      Please go to the emergency room for further evaluation of your headache    ED Prescriptions   None    PDMP not reviewed  this encounter.   Radford Pax, NP 11/13/22 1037    Radford Pax, NP 11/13/22 1038

## 2022-11-13 NOTE — ED Triage Notes (Signed)
Onset yesterday of flashing once with stating up and pain to back of eye.  Having blurry vision to left eye. Having shadow  in vision to left eye

## 2022-11-13 NOTE — ED Triage Notes (Signed)
Pt presents with c/o flashing in her eyes and throbbing in the back of her eye x 1 day. States she has blurred vision and a headache. States her eyes feel sore when wanting to move them.
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
## 2022-11-13 NOTE — Discharge Instructions (Signed)
Please go to the emergency room for further evaluation of your headache

## 2022-11-13 NOTE — ED Notes (Signed)
PIV secured for transport.  Patient ER to ER transport accepted by Dr. Rush Landmark at [facility]  Transport by POV.

## 2022-11-13 NOTE — ED Notes (Signed)
Meal tray provided per pt request.  

## 2022-11-13 NOTE — ED Provider Notes (Signed)
Blood pressure 133/71, pulse 80, temperature 98.5 F (36.9 C), resp. rate 18, height 5\' 7"  (1.702 m), weight 91.2 kg, last menstrual period 10/29/2022, SpO2 100%.  In short, Rita Caldwell is a 42 y.o. female with a chief complaint of Eye Problem .  Refer to the original H&P for additional details.  03:06 PM Patient arrives to the ED from outside hospital for MRI.   MRI with concern for optic neuritis. Plan for steroids and Neurology consultation. Patient to be admitted to family medicine service.    Maia Plan, MD 11/17/22 [phone]

## 2022-11-13 NOTE — Discharge Instructions (Addendum)
Dear Rita Caldwell,   Thank you for letting us participate in your care! In this section, you will find a brief hospital admission summary of why you were admitted to the hospital, what happened during your admission, your diagnosis/diagnoses, and recommended follow up.  Primary diagnosis: Optic Neuritis Treatment plan: You completed 2 days of IV steroids in the hospital. Please continue to take your prescribed Prednisone when you are discharged. You should take this for an additional 3 days. Please follow up with Neurology once you are discharged. A referral to [facility] has been sent - you should call to schedule and appointment next week.    POST-HOSPITAL & CARE INSTRUCTIONS We recommend establishing care with a PCP and following up within 1 week from being discharged from the hospital. Please let PCP/Specialists know of any changes in medications that were made which you will be able to see in the medications section of this packet.  DOCTOR'S APPOINTMENTS & FOLLOW UP Future Appointments  Date Time Provider Department Center  12/15/2022  8:30 AM Charlton Amor, DO [facility] None     Thank you for choosing [facility]! Take care and be well!  Family Medicine Teaching Service Inpatient Team [place]  [facility]  [address] [place], [place] [address] [phone]

## 2022-11-13 NOTE — ED Notes (Signed)
Pt complaining of wanting something to eat. Told her she has to wait for mri first. Found a charger for her phone and she is happy at the moment.

## 2022-11-13 NOTE — ED Provider Notes (Signed)
[facility] EMERGENCY DEPARTMENT AT [facility] Provider Note   CSN: [id number] Arrival date & time: 11/13/22  1051     History Chief Complaint  Patient presents with   Eye Problem    Rita Caldwell is a 42 y.o. female patient with history of multiple sclerosis who presents to the emergency department today for further evaluation of visual disturbances in the left eye in addition to eye pain.  She states that yesterday she got up from sitting position in her desk when she saw a flashing light in the left eye and then she has had blurred vision with some vision loss in the left eye.  She states that she has a left-sided headache in addition to painful extraocular movements behind the eye.  Her last MS flare was last year and she typically gets a flare yearly.  She denies any focal weakness or numbness in the arms or legs, trouble speaking, trouble walking.  Patient was seen evaluated urgent care who was sent here for further evaluation.   Eye Problem      Home Medications Prior to Admission medications   Medication Sig Start Date End Date Taking? Authorizing Provider  pantoprazole (PROTONIX) 40 MG tablet Take 1 tablet (40 mg total) by mouth daily. 11/15/22  Yes Glendale Chard, DO      Allergies    Gadolinium derivatives, Pineapple, and Penicillins    Review of Systems   Review of Systems  All other systems reviewed and are negative.   Physical Exam Updated Vital Signs BP (!) 130/52 (BP Location: Left Arm)   Pulse 74   Temp 97.6 F (36.4 C) (Oral)   Resp 18   Ht 5\' 7"  (1.702 m)   Wt 91.2 kg   LMP 10/29/2022 (Exact Date)   SpO2 100%   BMI 31.48 kg/m  Physical Exam Vitals and nursing note reviewed.  Constitutional:      General: She is not in acute distress.    Appearance: Normal appearance.  HENT:     Head: Normocephalic and atraumatic.  Eyes:     General:        Right eye: No discharge.        Left eye: No discharge.     Conjunctiva/sclera: Conjunctivae  normal.  Cardiovascular:     Comments: Regular rate and rhythm.  S1/S2 are distinct without any evidence of murmur, rubs, or gallops.  Radial pulses are 2+ bilaterally.  Dorsalis pedis pulses are 2+ bilaterally.  No evidence of pedal edema. Pulmonary:     Effort: Pulmonary effort is normal.     Comments: Clear to auscultation bilaterally.  Normal effort.  No respiratory distress.  No evidence of wheezes, rales, or rhonchi heard throughout. Abdominal:     General: Abdomen is flat. Bowel sounds are normal. There is no distension.     Tenderness: There is no abdominal tenderness. There is no guarding or rebound.  Musculoskeletal:        General: Normal range of motion.     Cervical back: Neck supple.  Skin:    General: Skin is warm and dry.     Findings: No rash.  Neurological:     General: No focal deficit present.     Mental Status: She is alert.     Comments: Cranial nerves II through XII are intact.  5/5 strength to the upper and lower extremities.  Extraocular movements are intact although subjectively painful per the patient.  Normal sensation to the upper and lower  extremities.  Speech is normal.  Psychiatric:        Mood and Affect: Mood normal.        Behavior: Behavior normal.     ED Results / Procedures / Treatments   Labs (all labs ordered are listed, but only abnormal results are displayed) Labs Reviewed  BASIC METABOLIC PANEL - Abnormal; Notable for the following components:      Result Value   Glucose, Bld 101 (*)    All other components within normal limits  COMPREHENSIVE METABOLIC PANEL - Abnormal; Notable for the following components:   Creatinine, Ser 1.03 (*)    Alkaline Phosphatase 36 (*)    All other components within normal limits  HEMOGLOBIN A1C - Abnormal; Notable for the following components:   Hgb A1c MFr Bld 6.1 (*)    All other components within normal limits  GLUCOSE, CAPILLARY - Abnormal; Notable for the following components:   Glucose-Capillary  102 (*)    All other components within normal limits  GLUCOSE, CAPILLARY - Abnormal; Notable for the following components:   Glucose-Capillary 176 (*)    All other components within normal limits  GLUCOSE, CAPILLARY - Abnormal; Notable for the following components:   Glucose-Capillary 174 (*)    All other components within normal limits  QUANTIFERON-TB GOLD PLUS - Abnormal; Notable for the following components:   QuantiFERON-TB Gold Plus Indeterminate (*)    All other components within normal limits  VITAMIN D 25 HYDROXY (VIT D DEFICIENCY, FRACTURES) - Abnormal; Notable for the following components:   Vit D, 25-Hydroxy 11.92 (*)    All other components within normal limits  GLUCOSE, CAPILLARY - Abnormal; Notable for the following components:   Glucose-Capillary 246 (*)    All other components within normal limits  GLUCOSE, CAPILLARY - Abnormal; Notable for the following components:   Glucose-Capillary 178 (*)    All other components within normal limits  CBC WITH DIFFERENTIAL/PLATELET  HIV ANTIBODY (ROUTINE TESTING W REFLEX)  CBC  VARICELLA ZOSTER ANTIBODY, IGG  HEPATITIS PANEL, ACUTE  JC VIRUS DNA,PCR (WHOLE BLOOD)  RPR  VITAMIN B12  QUANTIFERON-TB GOLD PLUS (RQFGPL)    EKG None  Radiology No results found.  Procedures Ultrasound ED Ocular  Date/Time: 11/13/2022 12:45 PM  Performed by: Teressa Lower, PA-C Authorized by: Teressa Lower, PA-C   PROCEDURE DETAILS:    Indications: eye pain and visual change     Assessed:  Left eye   Left eye axial view: obtained     Left eye saggital view: obtained     Limitations:  None LEFT EYE FINDINGS:     no foreign body noted in left eye    left eye lens not dislodged    no evidence of retinal detachment of the left eye    no ruptured globe in left eye    no vitreous hemorrhage in left eye     Medications Ordered in ED Medications - No data to display  ED Course/ Medical Decision Making/ A&P Clinical Course as  of 11/20/22 0913  Thu Nov 13, 2022  1125 Patient was now having left arm and tingliness briefly and is now improving. [CF]  1408 Spoke with Dr. Rush Landmark over at [facility] emergency department who accepts the patient for transfer. [CF]  1415 Patient does have a contrast allergy.  I switched MRIs to without contrast. [CF]    Clinical Course User Index [CF] Teressa Lower, PA-C   {   Click here for ABCD2, HEART  and other calculators  Medical Decision Making YANNIS GUMBS is a 42 y.o. female patient who presents to the emerged part today for further evaluation of left sided headache and visual disturbances in the left eye.  Patient is complaining of left eye pain as well given her history of MS there is certainly concern for optic neuritis.  Skin also be around attachment considering the flashing lights. Will plan to do an bedside US of the eye and a CT head without contrast.   There is no evidence of retinal detachment on ultrasound.  Given the patient's history of MS I will likely need to send her over to [facility] for an MRI of the brain and orbits.  Patient does have a contrast allergy so I will have to order this without contrast.  I spoke with Dr. Rush Landmark as held in the ED course who accepts transfer.  Patient will go by POV.  She is safe for discharge.  Amount and/or Complexity of Data Reviewed Labs: ordered. Radiology: ordered.  Risk Decision regarding hospitalization.    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    11/15/22 0831    pantoprazole (PROTONIX) 40 MG tablet  Daily        11/15/22 0831    Ambulatory referral to Neurology       Comments: An appointment is requested in approximately: 4 weeks w/  Dr. Epimenio Foot, Dr. Vickey Huger, or Dr. Lewie Loron,   11/14/22 0612              Honor Loh [place], PA-C 11/20/22 1610    Margarita Grizzle, MD 11/20/22 1558

## 2022-11-13 NOTE — Hospital Course (Addendum)
Rita Caldwell is a 42 y.o.female with a history of multiple sclerosis who was admitted to the [facility] Medicine Teaching Service at [facility] for optic neuritis.   Her hospital course is detailed below:  Optic Neuritis Patient presented to the ED with left eye vision changes and pain for the past two days with associated pain behind her left eye and headache all over her head. In the ED, patient underwent MRI brain without contrast/MR Orbits which showed scattered T2 flair hyperintensities, empty sella, and edema within left optic nerve concerning for acute left optic neuritis. Neurology was consulted who recommended IV Solu-Medrol for 3 to 5 days and inpatient admission for observation. Patient improved somewhat overnight after first dose of IV Solu-Medrol. She was ultimately discharged after receiving 2 doses of IV Solu-Medrol, with instructions to take oral Prednisone 1250mg  for 3 days after discharge. She was stable at time of discharge.  Other chronic conditions were medically managed with home medications and formulary alternatives as necessary (none)  PCP Follow-up Recommendations: Outpatient follow-up with Dr. Vickey Huger, Dr. Lewie Loron, or Dr. Epimenio Foot at [facility] (referral placed) by neurology

## 2022-11-14 DIAGNOSIS — R519 Headache, unspecified: Secondary | ICD-10-CM | POA: Diagnosis not present

## 2022-11-14 DIAGNOSIS — H539 Unspecified visual disturbance: Secondary | ICD-10-CM | POA: Diagnosis not present

## 2022-11-14 DIAGNOSIS — G360 Neuromyelitis optica [Devic]: Secondary | ICD-10-CM | POA: Diagnosis not present

## 2022-11-14 DIAGNOSIS — G379 Demyelinating disease of central nervous system, unspecified: Secondary | ICD-10-CM | POA: Diagnosis not present

## 2022-11-14 DIAGNOSIS — Z888 Allergy status to other drugs, medicaments and biological substances status: Secondary | ICD-10-CM | POA: Diagnosis not present

## 2022-11-14 DIAGNOSIS — Z91041 Radiographic dye allergy status: Secondary | ICD-10-CM | POA: Diagnosis not present

## 2022-11-14 DIAGNOSIS — Z87891 Personal history of nicotine dependence: Secondary | ICD-10-CM | POA: Diagnosis not present

## 2022-11-14 DIAGNOSIS — H5462 Unqualified visual loss, left eye, normal vision right eye: Secondary | ICD-10-CM | POA: Diagnosis present

## 2022-11-14 DIAGNOSIS — Z9109 Other allergy status, other than to drugs and biological substances: Secondary | ICD-10-CM | POA: Diagnosis not present

## 2022-11-14 DIAGNOSIS — Z880 Allergy status to penicillin: Secondary | ICD-10-CM | POA: Diagnosis not present

## 2022-11-14 DIAGNOSIS — Z820 Family history of epilepsy and other diseases of the nervous system: Secondary | ICD-10-CM | POA: Diagnosis not present

## 2022-11-14 DIAGNOSIS — G35 Multiple sclerosis: Secondary | ICD-10-CM | POA: Diagnosis present

## 2022-11-14 DIAGNOSIS — Z758 Other problems related to medical facilities and other health care: Secondary | ICD-10-CM

## 2022-11-14 LAB — COMPREHENSIVE METABOLIC PANEL
ALT: 18 U/L (ref 0–44)
AST: 17 U/L (ref 15–41)
Albumin: 3.8 g/dL (ref 3.5–5.0)
Alkaline Phosphatase: 36 U/L — ABNORMAL LOW (ref 38–126)
Anion gap: 7 (ref 5–15)
BUN: 8 mg/dL (ref 6–20)
CO2: 23 mmol/L (ref 22–32)
Calcium: 9.1 mg/dL (ref 8.9–10.3)
Chloride: 106 mmol/L (ref 98–111)
Creatinine, Ser: 1.03 mg/dL — ABNORMAL HIGH (ref 0.44–1.00)
GFR, Estimated: >60 mL/min (ref >60)
Glucose, Bld: 93 mg/dL (ref 70–99)
Potassium: 3.5 mmol/L (ref 3.5–5.1)
Sodium: 136 mmol/L (ref 135–145)
Total Bilirubin: 0.5 mg/dL (ref 0.3–1.2)
Total Protein: 6.9 g/dL (ref 6.5–8.1)

## 2022-11-14 LAB — GLUCOSE, CAPILLARY
Glucose-Capillary: 102 mg/dL — ABNORMAL HIGH (ref 70–99)
Glucose-Capillary: 176 mg/dL — ABNORMAL HIGH (ref 70–99)
Glucose-Capillary: 174 mg/dL — ABNORMAL HIGH (ref 70–99)
Glucose-Capillary: 246 mg/dL — ABNORMAL HIGH (ref 70–99)

## 2022-11-14 LAB — CBC
HCT: 39.5 % (ref 36.0–46.0)
Hemoglobin: 12.7 g/dL (ref 12.0–15.0)
MCH: 29.1 pg (ref 26.0–34.0)
MCHC: 32.2 g/dL (ref 30.0–36.0)
MCV: 90.6 fL (ref 80.0–100.0)
Platelets: 328 10*3/uL (ref 150–400)
RBC: 4.36 MIL/uL (ref 3.87–5.11)
RDW: 12.7 % (ref 11.5–15.5)
WBC: 7 10*3/uL (ref 4.0–10.5)
nRBC: 0 % (ref 0.0–0.2)

## 2022-11-14 LAB — HEMOGLOBIN A1C
Hgb A1c MFr Bld: 6.1 % — ABNORMAL HIGH (ref 4.8–5.6)
Mean Plasma Glucose: 128.37 mg/dL

## 2022-11-14 LAB — HIV ANTIBODY (ROUTINE TESTING W REFLEX): HIV Screen 4th Generation wRfx: NONREACTIVE

## 2022-11-14 MED ORDER — MELATONIN 3 MG PO TABS
3.0000 mg | ORAL_TABLET | Freq: Every day | ORAL | Status: DC
  Administered 2022-11-14: 3 mg via ORAL
  Filled 2022-11-14: qty 1

## 2022-11-14 MED ORDER — QUETIAPINE FUMARATE 25 MG PO TABS: 25.0000 mg | ORAL_TABLET | Freq: Every evening | ORAL | Status: DC | PRN

## 2022-11-14 MED ORDER — ENOXAPARIN SODIUM 40 MG/0.4ML IJ SOSY
40.0000 mg | PREFILLED_SYRINGE | INTRAMUSCULAR | Status: DC
  Administered 2022-11-14: 40 mg via SUBCUTANEOUS

## 2022-11-14 MED ORDER — SODIUM CHLORIDE 0.9 % IV SOLN
1000.0000 mg | INTRAVENOUS | Status: DC
  Administered 2022-11-15: 1000 mg via INTRAVENOUS
  Filled 2022-11-14 (×2): qty 16

## 2022-11-14 MED ORDER — PREDNISONE 50 MG PO TABS: 1250.0000 mg | ORAL_TABLET | ORAL | Status: DC

## 2022-11-14 MED ORDER — INSULIN ASPART 100 UNIT/ML IJ SOLN
0.0000 [IU] | Freq: Three times a day (TID) | INTRAMUSCULAR | Status: DC
  Administered 2022-11-14 (×2): 2 [IU] via SUBCUTANEOUS

## 2022-11-14 MED ORDER — ACETAMINOPHEN 325 MG PO TABS
650.0000 mg | ORAL_TABLET | Freq: Four times a day (QID) | ORAL | Status: DC | PRN
  Administered 2022-11-14: 650 mg via ORAL
  Filled 2022-11-14: qty 2

## 2022-11-14 MED ORDER — ACETAMINOPHEN 650 MG RE SUPP: 650.0000 mg | Freq: Four times a day (QID) | RECTAL | Status: DC | PRN

## 2022-11-14 MED ORDER — ORAL CARE MOUTH RINSE: 15.0000 mL | OROMUCOSAL | Status: DC | PRN

## 2022-11-14 NOTE — Assessment & Plan Note (Signed)
-   TOC for PCP

## 2022-11-14 NOTE — H&P (Addendum)
Hospital Admission History and Physical Service Pager: [phone]  Patient name: Rita Caldwell Medical record number: [id number] Date of Birth: Apr 25, 1981 Age: 42 y.o. Gender: female  Primary Care Provider: Patient, No Pcp Per Consultants: Neurology Code Status: FULL Preferred Emergency Contact:  Contact Information     Name Relation Home Work Mobile   Nino Parsley [phone]  [phone]      Other Contacts   None on File    Chief Complaint: Left eye vision changes and pain  Assessment and Plan: Rita Caldwell is a 42 y.o. female PMH of MS presenting with left eye vision changes and pain with eye movement since Tuesday. Differential for this patient's presentation of this includes optic neuritis which was seen on MR imaging of subtle edema within the left optic nerve.  Less likely to be infectious process such as orbital cellulitis given no edema or systemic symptoms of fevers/vomiting inpatient.  Unlikely to be acute stroke given no acute issues on MRI brain and no weakness on examination or other focal deficits. [facility]     * (Principal) Optic neuritis due to demyelinating disease of central  nervous system (HCC)     Left eye history and MR imaging findings consistent with optic  neuritis. Most likely in the setting of known demyelinating disease of MS.  Patient overall comfortable currently. Neurology consulted and recommended  admission for IV steroids. EDP discussed with neurology that this makes  her jittery and recommended timing it to start at 0600 7/20. -Admit to [facility], med surg, attending Dr. Pollie Meyer -Neurology following appreciate recommendations -IV Solumderol for 3-5 days per neurology recommendations -TOC for PCP needs -Tylenol PRN -AM CBC,CMP, HIV -Risk stratification labs per neurology         Monocular visual disturbance     Acute nonintractable headache  FEN/GI: Regular VTE Prophylaxis: Lovenox  Disposition: med surg  History of  Present Illness:  Rita Caldwell is a 42 y.o. female PMH of MS presenting with left eye vision changes and pain since Tuesday of this week.  She states that on Tuesday while she was at work she stood up and then gray around her vision on her left eye.  Looks as if the colors are tilted 5-8 dim color.  She states that it looks as if someone flushed a camera in her eye and continues to have blurred vision on the peripheral aspects of her visual fields.  She states that on Tuesday she also noticed that it started hurting with movement of her eye on the left side.  She also has been having associated pain behind her left eye and headache all over her head.  She denies any nausea or vomiting.  Denies any fevers.  Denies any sick symptoms of coughing.  Denies any symptoms on the right eye.  She states that when she has flareups that she does start to have tingling in her fingers and then the sensation of tingling goes all over her body.  She states that this sensation lasts for around 15 minutes when she does get it and it happens intermittently during these flareups.  She was originally diagnosed with MS in 2018 admission and since then has been trialed on 3 different medications however she was unable to tolerated due to increased tingling sensation when taking them.  She states she does not follow with a neurologist or have a PCP.  She states that since 2018 she has had about  2 flare ups every year since then.  On the original admission in 2018 she states she had some vision issues at that time however this feels much worse than what she had during that time.  Was initially seen and went over comments urgent care where they recommended ER evaluation. She was transferred to [facility] ED-ultrasound of the eye by ED provider showed no retinal detachment.  She developed some tingling of her left hand. CT head showed no acute intracranial abnormality. They recommended evaluation at [facility] for MRI of brain and orbits.  Upon being transferred to [facility] ED here MRI brain without contrast/MR Orbits-scattered T2 flair hyperintensities, empty sella, and edema within left optic nerve concerning for acute left optic neuritis. Dr. Iver Nestle with neurology was consulted who recommended IV Solu-Medrol for 3 to 5 days and inpatient admission for observation.  Review Of Systems: Per HPI  Pertinent Past Medical History: MS SVD Remainder reviewed in history tab.   Pertinent Past Surgical History: Tonsillectomy at 42 years old Remainder reviewed in history tab.   Pertinent Social History: Tobacco use: She had a few cigarettes is a 42 year old but no prominent history of smoking Alcohol use: None Other Substance use: Occasional marijuana use, none currently Lives with herself, she has joint custody with her ex of her child Lives in [place], baseline able to get around and do everything by herself  Pertinent Family History: Mom had MS Remainder reviewed in history tab.   Important Outpatient Medications: None, takes over-the-counter Goody powders/Tylenol/ibuprofen as needed Remainder reviewed in medication history.   Objective: BP 124/88 (BP Location: Right Arm)   Pulse 67   Temp 98.4 F (36.9 C) (Oral)   Resp 18   Ht 5\' 7"  (1.702 m)   Wt 91.2 kg   LMP 10/29/2022 (Exact Date)   SpO2 100%   BMI 31.48 kg/m  Exam: General: NAD, awake, alert, oriented Eyes: Pupils equal and reactive bilaterally (did not appreciate any afferent pupillary defect however this was difficult to examine fully in hallway bed in ED as unable to dim lights), does have some pain in the left eye on extraocular movements mostly when looking to the left.  No nystagmus, no periorbital swelling, no tenderness to palpation around temple, visual field deficit on the left eye when right eye covered with peripheral vision, right eye appeared normal ENTM: Moist mucous membranes Neck: Soft, range of motion intact Cardiovascular: RRR, no  murmurs or gallops Respiratory: Clear to auscultation bilaterally, no wheezes rales or crackles, no increased work of breathing on room air Gastrointestinal: Soft, nontender to palpation, nondistended, normoactive bowel sounds MSK: No lower extremity edema, good tone and strength Derm: No skin lesions visualized Neuro: CN II: PERRL (discomfort in left eye with light) CN III, IV,VI: EOMI (pain with this on left side) CV V: Normal sensation in V1, V2, V3 CVII: Symmetric smile and brow raise CN VIII: Normal hearing CN IX,X: Symmetric palate raise  CN XI: 5/5 shoulder shrug CN XII: Symmetric tongue protrusion  UE and LE strength 5/5 Normal sensation in UE and LE bilaterally  No ataxia with finger to nose, normal heel to shin  Negative Rhomberg   Psych: affect appropriate, pleasant  Labs:  CBC BMET  Recent Labs  Lab 11/14/22 0302  WBC 7.0  HGB 12.7  HCT 39.5  PLT 328   Recent Labs  Lab 11/13/22 1158  NA 136  K 3.6  CL 103  CO2 23  BUN 10  CREATININE 0.97  GLUCOSE  101*  CALCIUM 9.8    Pertinent additional labs  Imaging Studies Performed:  CT head without contrast-no acute intracranial abnormality MRI brain without contrast/MR Orbits-scattered T2 flair hyperintensities in patient with MS, empty sella, edema within left optic nerve concerning for acute left optic neuritis otherwise normal MRI  Levin Erp, MD 11/14/2022, 5:32 AM PGY-3, [facility]  FPTS Intern pager: [phone], text pages welcome Secure chat group [facility] Teaching Service

## 2022-11-14 NOTE — Assessment & Plan Note (Signed)
Left eye history and MR imaging findings consistent with optic neuritis. Most likely in the setting of known demyelinating disease of MS. Patient overall comfortable this AM.  -IV Solumderol for 3-5 days per neurology recommendations -TOC for PCP needs -Tylenol PRN -Risk stratification labs per neurology, pending -Neurology signed off, appreciate recommendations

## 2022-11-14 NOTE — Progress Notes (Addendum)
     Daily Progress Note Intern Pager: [phone]  Patient name: Rita Caldwell Medical record number: [id number] Date of birth: Jul 15, 1980 Age: 42 y.o. Gender: female  Primary Care Provider: Patient, No Pcp Per Consultants: Neurology Code Status: FULL  Pt Overview and Major Events to Date:  Admitted to FMTS 7/19  Assessment and Plan: Rita Caldwell is a 42 y.o. female PMH of MS presenting with left eye vision changes and pain with eye movement since Tuesday 11/11/22.   Per Neurology, patient may be able to go home on high dose prednisone instead of staying in the hospital for IV Solumedrol. Will discuss further for final recommendations.  [facility]     * (Principal) Optic neuritis due to demyelinating disease of central  nervous system (HCC)     Left eye history and MR imaging findings consistent with optic  neuritis. Most likely in the setting of known demyelinating disease of MS.  Patient overall comfortable this AM.  -IV Solumderol for 3-5 days per neurology recommendations -TOC for PCP needs -Tylenol PRN -Risk stratification labs per neurology, pending -Neurology signed off, appreciate recommendations         Does not have primary care provider     - TOC for PCP       FEN/GI: Regular PPx: Lovenox Dispo:Home tomorrow. Barriers include tolerate PO steroids.   Subjective:  Patient seen this morning sitting up in bed, about to eat breakfast. She states she is feeling better, but is somewhat overwhelmed with everything that has happened in the last 24 hours. She denies HA, SoB, abdominal pain. Reports vision is the same as on arrival. She does not have a PCP but would like to get connected with one in the [facility] area where she lives.  Objective: Temp:  [97.8 F (36.6 C)-98.9 F (37.2 C)] 98.9 F (37.2 C) (07/19 1251) Pulse Rate:  [63-89] 89 (07/19 1251) Resp:  [16-20] 20 (07/19 1251) BP: (124-136)/(71-91) 134/77 (07/19 1251) SpO2:  [98 %-100 %] 99 % (07/19  1251) Physical Exam: General: well-appearing, no acute distress Eyes: PERRLA, still with some pain in left eye on extraocular movement Cardiovascular: RRR, no murmurs Respiratory: CTA bilaterally, on room air Abdomen: soft, nontender Extremities: moves all extremities equally, no edema Neuro: alert and oriented  Laboratory: Most recent CBC Lab Results  Component Value Date   WBC 7.0 11/14/2022   HGB 12.7 11/14/2022   HCT 39.5 11/14/2022   MCV 90.6 11/14/2022   PLT 328 11/14/2022   Most recent BMP    Latest Ref Rng & Units 11/14/2022    3:02 AM  BMP  Glucose 70 - 99 mg/dL 93   BUN 6 - 20 mg/dL 8   Creatinine 1.47 - 8.29 mg/dL 5.62   Sodium 130 - 865 mmol/L 136   Potassium 3.5 - 5.1 mmol/L 3.5   Chloride 98 - 111 mmol/L 106   CO2 22 - 32 mmol/L 23   Calcium 8.9 - 10.3 mg/dL 9.1     Cyndia Skeeters, DO 11/14/2022, 1:58 PM  PGY-1, [facility] FPTS Intern pager: [phone], text pages welcome Secure chat group [facility] Teaching Service

## 2022-11-14 NOTE — Assessment & Plan Note (Signed)
Patient reports vision same as upon arrival. Some pain with extraocular movements in left eye. - continue to monitor

## 2022-11-14 NOTE — Progress Notes (Signed)
[facility] PRIMARY CARE MEDCTR [place]   [phone] [place] [address]     Next Steps: Go on 12/15/2022 Instructions: Arranged for 12/15/2022 at 8:30 am with Dr. Stephens Shire to establish primary care  Pt without PCP.  Appointment arranged by NCM and noted on AVS, pt made aware. Gae Gallop RN,BSN,CM [phone]

## 2022-11-14 NOTE — Assessment & Plan Note (Addendum)
Left eye history and MR imaging findings consistent with optic neuritis. Most likely in the setting of known demyelinating disease of MS. Patient overall comfortable this AM.  -IV Solumderol for 3-5 days per neurology recommendations -TOC for PCP needs -Tylenol PRN -Risk stratification labs per neurology, pending -Neurology signed off, appreciate recommendations

## 2022-11-14 NOTE — ED Notes (Signed)
ED TO INPATIENT HANDOFF REPORT  ED Nurse Name and Phone #: Aggie Cosier [phone]  S Name/Age/Gender Rita Caldwell 42 y.o. female Room/Bed: 045C/045C  Code Status   Code Status: Prior  Home/SNF/Other Home Patient oriented to: self, place, time, and situation Is this baseline? Yes   Triage Complete: Triage complete  Chief Complaint Eye Problem  Triage Note Onset yesterday of flashing once with stating up and pain to back of eye.  Having blurry vision to left eye. Having shadow  in vision to left eye   Allergies Allergies  Allergen Reactions   Contrast Media [Iodinated Contrast Media] Hives   Pineapple Hives   Penicillins Rash    Level of Care/Admitting Diagnosis ED Disposition     ED Disposition  Admit   Condition  --   Comment  The patient appears reasonably stabilized for admission considering the current resources, flow, and capabilities available in the ED at this time, and I doubt any other [facility] requiring further screening and/or treatment in the ED prior to admission is  present.          B Medical/Surgery History Past Medical History:  Diagnosis Date   MS (multiple sclerosis) (HCC) 02/25/2017   Past Surgical History:  Procedure Laterality Date   TONSILLECTOMY     age 41     A IV Location/Drains/Wounds Patient Lines/Drains/Airways Status     Active Line/Drains/Airways     Name Placement date Placement time Site Days   Peripheral IV 11/13/22 20 G 1" Right Antecubital 11/13/22  1158  Antecubital  1            Intake/Output Last 24 hours No intake or output data in the 24 hours ending 11/14/22 0002  Labs/Imaging Results for orders placed or performed during the hospital encounter of 11/13/22 (from the past 48 hour(s))  CBC with Differential     Status: None   Collection Time: 11/13/22 11:58 AM  Result Value Ref Range   WBC 6.6 4.0 - 10.5 K/uL   RBC 4.56 3.87 - 5.11 MIL/uL   Hemoglobin 13.6 12.0 - 15.0 g/dL   HCT 47.8 29.5 - 62.1 %   MCV  91.0 80.0 - 100.0 fL   MCH 29.8 26.0 - 34.0 pg   MCHC 32.8 30.0 - 36.0 g/dL   RDW 30.8 65.7 - 84.6 %   Platelets 360 150 - 400 K/uL   nRBC 0.0 0.0 - 0.2 %   Neutrophils Relative % 46 %   Neutro Abs 3.0 1.7 - 7.7 K/uL   Lymphocytes Relative 46 %   Lymphs Abs 3.1 0.7 - 4.0 K/uL   Monocytes Relative 7 %   Monocytes Absolute 0.5 0.1 - 1.0 K/uL   Eosinophils Relative 1 %   Eosinophils Absolute 0.0 0.0 - 0.5 K/uL   Basophils Relative 0 %   Basophils Absolute 0.0 0.0 - 0.1 K/uL   Immature Granulocytes 0 %   Abs Immature Granulocytes 0.01 0.00 - 0.07 K/uL    Comment: Performed at [facility], [address] [place], [place] [address]  Basic metabolic panel     Status: Abnormal   Collection Time: 11/13/22 11:58 AM  Result Value Ref Range   Sodium 136 135 - 145 mmol/L   Potassium 3.6 3.5 - 5.1 mmol/L   Chloride 103 98 - 111 mmol/L   CO2 23 22 - 32 mmol/L   Glucose, Bld 101 (H) 70 - 99 mg/dL    Comment: Glucose reference range applies only to samples  taken after fasting for at least 8 hours.   BUN 10 6 - 20 mg/dL   Creatinine, Ser 0.98 0.44 - 1.00 mg/dL   Calcium 9.8 8.9 - 11.9 mg/dL   GFR, Estimated >14 >78 mL/min    Comment: (NOTE) Calculated using the CKD-EPI Creatinine Equation (2021)    Anion gap 10 5 - 15    Comment: Performed at [facility], [address] [place], [place] [address]   MR BRAIN WO CONTRAST  Result Date: 11/13/2022 CLINICAL DATA:  Initial evaluation for visual loss, monocular. EXAM: MRI HEAD AND ORBITS WITHOUT CONTRAST TECHNIQUE: Multiplanar, multi-echo pulse sequences of the brain and surrounding structures were acquired without intravenous contrast. Multiplanar, multi-echo pulse sequences of the orbits and surrounding structures were acquired including fat saturation techniques, without intravenous contrast administration. COMPARISON:  CT from earlier the same day. FINDINGS: MRI HEAD FINDINGS Brain: Cerebral volume  within normal limits. Scattered T2/FLAIR hyperintensities are seen involving the periventricular, deep, and juxta cortical white matter both cerebral hemispheres. A few of these foci are oriented perpendicular to the lateral ventricles. Few scattered corresponding T1 black holes. Appearance is consistent with patient history of demyelinating disease/multiple sclerosis. No overt evidence for active demyelination on this noncontrast examination. No evidence for acute or subacute ischemia. Gray-white matter differentiation maintained. No areas of chronic cortical infarction. No acute or chronic intracranial blood products. No mass lesion, midline shift or mass effect. No hydrocephalus or extra-axial fluid collection. Empty sella noted. Suprasellar region normal. Incidental 4 mm simple cyst within the pineal gland, of doubtful significance. Vascular: Major intracranial vascular flow voids are maintained. Skull and upper cervical spine: Craniocervical junction with normal limits. Bone marrow signal intensity normal. No scalp soft tissue abnormality. Other: Mastoid air cells are clear. MRI ORBITS FINDINGS Orbits: Globes are symmetric in size with normal appearance and morphology bilaterally. Optic nerves are fairly symmetric in size bilaterally. However, there is suspected subtle edema within the left optic nerve, raising the concern for acute optic neuritis (series 10, images 14, 12, 11). No other abnormality about either optic nerve sheath complex. Intraconal and extraconal fat maintained. Extra-ocular muscles symmetric and normal. Lacrimal glands within normal limits. No other abnormality about the orbital apices or cavernous sinus. Visualized sinuses: Visualized paranasal sinuses are largely clear. Soft tissues: Unremarkable. IMPRESSION: MRI HEAD IMPRESSION: 1. Scattered T2/FLAIR hyperintensities involving the supratentorial cerebral white matter, consistent with patient history of demyelinating disease/multiple  sclerosis. No overt evidence for active demyelination on this noncontrast examination. 2. Empty sella. MRI ORBITS IMPRESSION: 1. Suspected subtle edema within the left optic nerve, raising the concern for acute left optic neuritis. Finding could be confirmed with postcontrast imaging as warranted. 2. Otherwise normal MRI of the orbits. Electronically Signed   By: Rise Mu M.D.   On: 11/13/2022 21:51   MR ORBITS WO CONTRAST  Result Date: 11/13/2022 CLINICAL DATA:  Initial evaluation for visual loss, monocular. EXAM: MRI HEAD AND ORBITS WITHOUT CONTRAST TECHNIQUE: Multiplanar, multi-echo pulse sequences of the brain and surrounding structures were acquired without intravenous contrast. Multiplanar, multi-echo pulse sequences of the orbits and surrounding structures were acquired including fat saturation techniques, without intravenous contrast administration. COMPARISON:  CT from earlier the same day. FINDINGS: MRI HEAD FINDINGS Brain: Cerebral volume within normal limits. Scattered T2/FLAIR hyperintensities are seen involving the periventricular, deep, and juxta cortical white matter both cerebral hemispheres. A few of these foci are oriented perpendicular to the lateral ventricles. Few scattered corresponding T1 black holes. Appearance is consistent with  patient history of demyelinating disease/multiple sclerosis. No overt evidence for active demyelination on this noncontrast examination. No evidence for acute or subacute ischemia. Gray-white matter differentiation maintained. No areas of chronic cortical infarction. No acute or chronic intracranial blood products. No mass lesion, midline shift or mass effect. No hydrocephalus or extra-axial fluid collection. Empty sella noted. Suprasellar region normal. Incidental 4 mm simple cyst within the pineal gland, of doubtful significance. Vascular: Major intracranial vascular flow voids are maintained. Skull and upper cervical spine: Craniocervical junction  with normal limits. Bone marrow signal intensity normal. No scalp soft tissue abnormality. Other: Mastoid air cells are clear. MRI ORBITS FINDINGS Orbits: Globes are symmetric in size with normal appearance and morphology bilaterally. Optic nerves are fairly symmetric in size bilaterally. However, there is suspected subtle edema within the left optic nerve, raising the concern for acute optic neuritis (series 10, images 14, 12, 11). No other abnormality about either optic nerve sheath complex. Intraconal and extraconal fat maintained. Extra-ocular muscles symmetric and normal. Lacrimal glands within normal limits. No other abnormality about the orbital apices or cavernous sinus. Visualized sinuses: Visualized paranasal sinuses are largely clear. Soft tissues: Unremarkable. IMPRESSION: MRI HEAD IMPRESSION: 1. Scattered T2/FLAIR hyperintensities involving the supratentorial cerebral white matter, consistent with patient history of demyelinating disease/multiple sclerosis. No overt evidence for active demyelination on this noncontrast examination. 2. Empty sella. MRI ORBITS IMPRESSION: 1. Suspected subtle edema within the left optic nerve, raising the concern for acute left optic neuritis. Finding could be confirmed with postcontrast imaging as warranted. 2. Otherwise normal MRI of the orbits. Electronically Signed   By: Rise Mu M.D.   On: 11/13/2022 21:51   CT Head Wo Contrast  Result Date: 11/13/2022 CLINICAL DATA:  Vision loss, monocular. EXAM: CT HEAD WITHOUT CONTRAST TECHNIQUE: Contiguous axial images were obtained from the base of the skull through the vertex without intravenous contrast. RADIATION DOSE REDUCTION: This exam was performed according to the departmental dose-optimization program which includes automated exposure control, adjustment of the mA and/or kV according to patient size and/or use of iterative reconstruction technique. COMPARISON:  None Available. FINDINGS: Brain: No acute  intracranial hemorrhage. Gray-white differentiation is preserved. No hydrocephalus or extra-axial collection. No mass effect or midline shift. Vascular: No hyperdense vessel or unexpected calcification. Skull: No calvarial fracture or suspicious bone lesion. Skull base is unremarkable. Sinuses/Orbits: Unremarkable. Other: None. IMPRESSION: No acute intracranial abnormality. Electronically Signed   By: Orvan Falconer M.D.   On: 11/13/2022 13:43    Pending Labs Unresulted Labs (From admission, onward)    None       Vitals/Pain Today's Vitals   11/13/22 1917 11/13/22 2006 11/13/22 2319 11/13/22 2319  BP:  (!) 134/91  129/88  Pulse:  70  63  Resp:  18  16  Temp:  98.2 F (36.8 C)  98.6 F (37 C)  TempSrc:  Oral  Oral  SpO2: 100% 100%  100%  Weight:      Height:      PainSc:  0-No pain 0-No pain     Isolation Precautions No active isolations  Medications Medications  methylPREDNISolone sodium succinate (SOLU-MEDROL) 1,000 mg in sodium chloride 0.9 % 50 mL IVPB (has no administration in time range)    Mobility walks     Focused Assessments    R Recommendations: See Admitting Provider Note  Report given to:   Additional Notes: N/a

## 2022-11-14 NOTE — Assessment & Plan Note (Addendum)
Patient denies HA this morning. - continue to monitor - tylenol prn

## 2022-11-14 NOTE — Plan of Care (Signed)
  Problem: Education: Goal: Knowledge of General Education information will improve Description: Including pain rating scale, medication(s)/side effects and non-pharmacologic comfort measures Outcome: Progressing   Problem: Clinical Measurements: Goal: Ability to maintain clinical measurements within normal limits will improve Outcome: Progressing Goal: Will remain free from infection Outcome: Progressing   Problem: Clinical Measurements: Goal: Cardiovascular complication will be avoided Outcome: Not Progressing

## 2022-11-14 NOTE — Progress Notes (Signed)
Spoke with Neurology, Dr. Derry Lory regarding plan for steroid management for 5 days total.  Dr. Derry Lory confirmed correct dose of PO Prednisone 1250mg . Patient has had 1 dose of IV Solumedrol so far, so would need 4 more days of Prednisone if she discharges today.  From our perspective it is more likely that patient will discharge tomorrow. Will plan to give 2nd dose of IV Solumedrol tomorrow and then discharge patient with 3 days of PO Prednisone 1250mg .  Appreciate Neurology recommendations.

## 2022-11-15 ENCOUNTER — Other Ambulatory Visit (HOSPITAL_COMMUNITY): Payer: Self-pay

## 2022-11-15 LAB — HEPATITIS PANEL, ACUTE
HCV Ab: NONREACTIVE
Hep A IgM: NONREACTIVE
Hep B C IgM: NONREACTIVE
Hepatitis B Surface Ag: NONREACTIVE

## 2022-11-15 LAB — GLUCOSE, CAPILLARY: Glucose-Capillary: 178 mg/dL — ABNORMAL HIGH (ref 70–99)

## 2022-11-15 LAB — VITAMIN D 25 HYDROXY (VIT D DEFICIENCY, FRACTURES): Vit D, 25-Hydroxy: 11.92 ng/mL — ABNORMAL LOW (ref 30–100)

## 2022-11-15 LAB — RPR: RPR Ser Ql: NONREACTIVE

## 2022-11-15 LAB — VITAMIN B12: Vitamin B-12: 359 pg/mL (ref 180–914)

## 2022-11-15 MED ORDER — PREDNISONE 50 MG PO TABS
ORAL_TABLET | ORAL | 0 refills | Status: AC
Start: 2022-11-16 — End: 2022-11-18
  Filled 2022-11-15: qty 60, 2d supply, fill #0
  Filled 2022-11-15: qty 15, 1d supply, fill #0
  Filled 2022-11-15: qty 60, 2d supply, fill #0

## 2022-11-15 MED ORDER — PANTOPRAZOLE SODIUM 40 MG PO TBEC
40.0000 mg | DELAYED_RELEASE_TABLET | Freq: Every day | ORAL | 0 refills | Status: DC
  Filled 2022-11-15: qty 15, 15d supply, fill #0

## 2022-11-15 NOTE — Progress Notes (Signed)
Patient ready for discharge to home; discharge instructions given and reviewed; Rx's filled from [facility] here at [facility] and brought to the patient. Patient dressed and ready for release; discharged out via wheelchair.

## 2022-11-15 NOTE — Discharge Summary (Addendum)
Family Medicine Teaching [facility] Discharge Summary  Patient name: Rita Caldwell Medical record number: [id number] Date of birth: [birth year] Age: 42 y.o. Gender: female Date of Admission: 11/13/2022  Date of Discharge: 11/15/22  Admitting Physician: Levin Erp, MD  Primary Care Provider: Charlton Amor, DO Consultants: Neurology   Indication for Hospitalization: Loss of Vision in Left Eye 2/2 to [facility]   Brief Hospital Course:  Rita Caldwell is a 42 y.o.female with a history of multiple sclerosis who was admitted to the [facility] Medicine Teaching Service at [facility] for optic neuritis.   Her hospital course is detailed below:  Optic Neuritis Patient presented to the ED with left eye vision changes and pain for the past two days with associated pain behind her left eye and headache all over her head. In the ED, patient underwent MRI brain without contrast/MR Orbits which showed scattered T2 flair hyperintensities, empty sella, and edema within left optic nerve concerning for acute left optic neuritis. Neurology was consulted who recommended IV Solu-Medrol for 3 to 5 days and inpatient admission for observation.  She was ultimately discharged after receiving 2 doses of IV Solu-Medrol, with instructions to take oral Prednisone 1250mg  for 3 days after discharge with 40 mg Protonix to reduce stomach irritation. She was stable at time of discharge with improvement of her vision. Due to her living in [place] she requested to follow up with [facility] neurology. Referral sent at discharge with patient to schedule follow up.   Other chronic conditions were medically managed with home medications and formulary alternatives as necessary (none)  PCP Follow-up Recommendations: Outpatient follow-up with Dr. Vickey Huger, Dr. Lewie Loron, or Dr. Epimenio Foot at [facility] (referral placed) by neurology. Follow up with Neuro-Immunology at [facility]. Referral sent.  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 Pulm: Clear, no wheezing, no crackles. No increased work of breathing Abdominal: bowel sounds present, soft, non-tender, non-distended Extremities: no peripheral edema  Neuro: alert and oriented x3, speech normal in content, no facial asymmetry, strength intact and equal bilaterally in UE and LE, pupils equal and reactive to light, vision improving in left eye - with visual fields intact.  Psych:  Cognition and judgment appear intact. Alert, communicative  and cooperative with normal attention span and concentration. No apparent delusions, illusions, hallucinations    Significant Procedures: none   Significant Labs and Imaging:  Recent Labs  Lab 11/13/22 1158 11/14/22 0302  WBC 6.6 7.0  HGB 13.6 12.7  HCT 41.5 39.5  PLT 360 328   Recent Labs  Lab 11/13/22 1158 11/14/22 0302  NA 136 136  K 3.6 3.5  CL 103 106  CO2 23 23  GLUCOSE 101* 93  BUN 10 8  CREATININE 0.97 1.03*  CALCIUM 9.8 9.1  ALKPHOS  --  36*  AST  --  17  ALT  --  18  ALBUMIN  --  3.8    CT Head WO Contrast: 7/18 IMPRESSION: No acute intracranial abnormality.  MR Brain WO Contrast: 7/18 IMPRESSION: 1. Scattered T2/FLAIR hyperintensities involving the supratentorial cerebral white matter,  consistent with patient history of demyelinating disease/multiple sclerosis. No overt evidence for active demyelination on this noncontrast examination. 2. Empty sella.  MR Orbits WO Contrast: 7/18 IMPRESSION: 1. Suspected subtle edema within the left optic nerve, raising  the concern for acute left optic neuritis. Finding could be confirmed with postcontrast imaging as warranted. 2. Otherwise normal MRI of the orbits.  Results/Tests Pending at Time of Discharge:  - RPR - Hepatitis panel  - Quant gold  - JC virus DNA PCR - Vit D - Varicella zoster antibody    Discharge Medications:  Allergies as of 11/15/2022       Reactions   Gadolinium Derivatives Anaphylaxis   Pineapple Hives   Penicillins Rash        Medication List     TAKE these medications    pantoprazole 40 MG tablet Commonly known as: Protonix Take 1 tablet (40 mg total) by mouth daily.   predniSONE 50 MG tablet Commonly known as: DELTASONE Take 1,250 mg daily (25 tablets) for the next 3 days Start taking on: November 16, 2022        Discharge Instructions: Please refer to Patient Instructions section of EMR for full details.  Patient was counseled important signs and symptoms that should prompt return to medical care, changes in medications, dietary instructions, activity restrictions, and follow up appointments.   Follow-Up Appointments:  Follow-up Information     [facility].   Contact information: [address] [place] [address] [phone]        [facility] [place]. Go on 12/15/2022.   Why: Arranged for 12/15/2022 at 8:30 am with Dr. Stephens Shire to establish primary care Contact information: [address] [place] [address] [phone]                Glendale Chard, DO 11/15/2022, 9:11 AM PGY-2, [facility]

## 2022-11-16 LAB — VARICELLA ZOSTER ANTIBODY, IGG: Varicella IgG: 2279 index (ref >165)

## 2022-11-18 LAB — QUANTIFERON-TB GOLD PLUS (RQFGPL)
QuantiFERON Mitogen Value: 0.08 IU/mL
QuantiFERON Nil Value: 0.03 IU/mL
QuantiFERON TB1 Ag Value: 0.03 IU/mL
QuantiFERON TB2 Ag Value: 0.04 IU/mL

## 2022-11-18 LAB — QUANTIFERON-TB GOLD PLUS: QuantiFERON-TB Gold Plus: UNDETERMINED — AB

## 2022-11-19 LAB — JC VIRUS DNA,PCR (WHOLE BLOOD): JC Virus DNA, PCR, Blood: NEGATIVE

## 2022-11-20 ENCOUNTER — Telehealth: Payer: 59 | Admitting: Nurse Practitioner

## 2022-11-20 DIAGNOSIS — R519 Headache, unspecified: Secondary | ICD-10-CM | POA: Diagnosis not present

## 2022-11-20 DIAGNOSIS — H469 Unspecified optic neuritis: Secondary | ICD-10-CM | POA: Diagnosis not present

## 2022-11-20 NOTE — Progress Notes (Signed)
Virtual Visit Consent   TRUST CRAGO, you are scheduled for a virtual visit with a  provider today. Just as with appointments in the office, your consent must be obtained to participate. Your consent will be active for this visit and any virtual visit you may have with one of our providers in the next 365 days. If you have a MyChart account, a copy of this consent can be sent to you electronically.  As this is a virtual visit, video technology does not allow for your provider to perform a traditional examination. This may limit your provider's ability to fully assess your condition. If your provider identifies any concerns that need to be evaluated in person or the need to arrange testing (such as labs, EKG, etc.), we will make arrangements to do so. Although advances in technology are sophisticated, we cannot ensure that it will always work on either your end or our end. If the connection with a video visit is poor, the visit may have to be switched to a telephone visit. With either a video or telephone visit, we are not always able to ensure that we have a secure connection.  By engaging in this virtual visit, you consent to the provision of healthcare and authorize for your insurance to be billed (if applicable) for the services provided during this visit. Depending on your insurance coverage, you may receive a charge related to this service.  I need to obtain your verbal consent now. Are you willing to proceed with your visit today? Rita Caldwell has provided verbal consent on 11/20/2022 for a virtual visit (video or telephone). Viviano Simas, FNP  Date: 11/20/2022 2:16 PM  Virtual Visit via Video Note   I, Viviano Simas, connected with  Rita Caldwell  ([id number], 22-Oct-1980) on 11/20/22 at  2:15 PM EDT by a video-enabled telemedicine application and verified that I am speaking with the correct person using two identifiers.  Location: Patient: Virtual Visit Location Patient: Home Provider:  Virtual Visit Location Provider: Home Office   I discussed the limitations of evaluation and management by telemedicine and the availability of in person appointments. The patient expressed understanding and agreed to proceed.    History of Present Illness: Rita Caldwell is a 42 y.o. who identifies as a female who was assigned female at birth, and is being seen today for ongoing symptoms since recent hospitalization at [facility]   She went to ED for non intractable headache and optic neuritis 11/13/22 discharged 11/15/22  Pre existing MS (dx 2018)  Having a hard time eating now, ongoing worsening headache   She finished her prednisone 2 days ago and feels symptoms have worsened since that time   She is also requesting help with FMLA paperwork.  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  Resting comfortably at home.  Head is normocephalic, atraumatic.  No labored breathing.  Speech is clear and coherent with logical content.  Patient is alert and oriented at baseline.    Assessment and Plan:  1. Optic neuritis  2. Acute nonintractable headache, unspecified headache type   Due to worsening symptoms advised return to  ED for assessment  Patient is agreeable to plan  Has transportation to ED   Follow Up Instructions: I discussed the assessment and treatment plan with the patient. The patient was provided an opportunity to ask questions and all were answered. The patient agreed with the plan and demonstrated an understanding of the instructions.  A copy of instructions were sent to the patient via MyChart unless otherwise noted below.    The patient was advised to call back or seek an in-person evaluation if the symptoms worsen or if the condition fails to improve as anticipated.  Time:  I spent 15 minutes with the patient via telehealth technology discussing the above problems/concerns.    Viviano Simas, FNP

## 2022-11-21 DIAGNOSIS — H538 Other visual disturbances: Secondary | ICD-10-CM | POA: Insufficient documentation

## 2022-12-02 ENCOUNTER — Telehealth: Payer: Self-pay | Admitting: *Deleted

## 2022-12-02 ENCOUNTER — Ambulatory Visit (INDEPENDENT_AMBULATORY_CARE_PROVIDER_SITE_OTHER): Payer: 59 | Admitting: Neurology

## 2022-12-02 ENCOUNTER — Encounter: Payer: Self-pay | Admitting: Neurology

## 2022-12-02 VITALS — BP 127/83 | HR 77 | Ht 67.0 in | Wt 208.5 lb

## 2022-12-02 DIAGNOSIS — G35 Multiple sclerosis: Secondary | ICD-10-CM

## 2022-12-02 DIAGNOSIS — G360 Neuromyelitis optica [Devic]: Secondary | ICD-10-CM | POA: Diagnosis not present

## 2022-12-02 DIAGNOSIS — Z79899 Other long term (current) drug therapy: Secondary | ICD-10-CM

## 2022-12-02 DIAGNOSIS — G379 Demyelinating disease of central nervous system, unspecified: Secondary | ICD-10-CM | POA: Diagnosis not present

## 2022-12-02 NOTE — Telephone Encounter (Signed)
Placed [facility] lab in quest lock box for routine lab pick up. Results pending. 
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
## 2022-12-15 ENCOUNTER — Ambulatory Visit (INDEPENDENT_AMBULATORY_CARE_PROVIDER_SITE_OTHER): Payer: 59 | Admitting: Family Medicine

## 2022-12-15 DIAGNOSIS — Z91199 Patient's noncompliance with other medical treatment and regimen due to unspecified reason: Secondary | ICD-10-CM

## 2022-12-15 NOTE — Progress Notes (Signed)
No shw

## 2022-12-16 ENCOUNTER — Telehealth: Payer: Self-pay

## 2022-12-16 NOTE — Telephone Encounter (Signed)
I called patient. I advised her that labs were okay to start Ocrevus. I will fax Ocrevus Start Form to [facility] and give order to [facility]. I will send the patient a mychart message with the [facility]'s phone number. Pt verbalized understanding of results and recommendations. Pt had no questions at this time but was encouraged to call back if questions arise.  Faxed Ocrevus Start Form to [facility]. Received a receipt of confirmation.

## 2022-12-16 NOTE — Telephone Encounter (Signed)
-----   Message from Asa Lente sent at 12/14/2022  4:13 PM EDT ----- Labs are ok for Ocrevus (or Briumvi as backup)

## 2022-12-16 NOTE — Telephone Encounter (Signed)
Ocrevus order and start form given to [facility] in [employer].

## 2022-12-16 NOTE — Telephone Encounter (Signed)
JCV INDEX VALUE:0.26 JCV ANTIBODY: INDETERMINATE  INHIBITION ASSAY: FINAL RESULT: NEGATIVE

## 2023-01-19 ENCOUNTER — Telehealth: Payer: Self-pay | Admitting: Neurology

## 2023-01-19 NOTE — Telephone Encounter (Signed)
Pt is asking for a call to inform her where it is she is suppose to go for her infusion treatment

## 2023-01-20 NOTE — Telephone Encounter (Signed)
Pt called asking what infusion site is she going to?  I sent message back [facility].  Holly with [facility] was waiting on pt to get back with her on her insurance as pt was in between jobs.  Jeanice Lim will call her today.

## 2023-01-20 NOTE — Telephone Encounter (Signed)
I called pt and she will upload insurance.  Will get to [facility].

## 2023-03-09 NOTE — Telephone Encounter (Signed)
Patient's first Ocrevus infusion was 03/04/2023.

## 2023-04-14 ENCOUNTER — Encounter: Payer: Self-pay | Admitting: Neurology

## 2023-04-14 ENCOUNTER — Ambulatory Visit: Payer: BC Managed Care – PPO | Admitting: Neurology

## 2023-08-10 ENCOUNTER — Encounter: Payer: Self-pay | Admitting: *Deleted

## 2023-08-10 NOTE — Telephone Encounter (Signed)
 Received message from Holly/[facility] that they need pt updated insurance cards, they LVM for pt.  They asked we also try to reach out. Upon review of chart, she is past due for appt. Will need to be seen prior to next Ocrevus infusion.   I LVM for pt to call.

## 2023-08-11 NOTE — Telephone Encounter (Signed)
 Called pt. Scheduled appt for 08/20/23 at 1:30pm, check in 1:00pm. Explained visit/labs/updated insurance cards needed prior to next infusion. [facility] notified of appt.

## 2023-08-20 ENCOUNTER — Encounter: Payer: Self-pay | Admitting: Neurology

## 2023-08-20 ENCOUNTER — Ambulatory Visit (INDEPENDENT_AMBULATORY_CARE_PROVIDER_SITE_OTHER): Admitting: Neurology

## 2023-08-20 VITALS — BP 132/83 | HR 76 | Ht 67.0 in | Wt 211.0 lb

## 2023-08-20 DIAGNOSIS — G360 Neuromyelitis optica [Devic]: Secondary | ICD-10-CM

## 2023-08-20 DIAGNOSIS — G379 Demyelinating disease of central nervous system, unspecified: Secondary | ICD-10-CM | POA: Diagnosis not present

## 2023-08-20 DIAGNOSIS — G35 Multiple sclerosis: Secondary | ICD-10-CM | POA: Diagnosis not present

## 2023-08-20 DIAGNOSIS — Z79899 Other long term (current) drug therapy: Secondary | ICD-10-CM | POA: Diagnosis not present
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
## 2023-08-22 LAB — CBC WITH DIFFERENTIAL/PLATELET
Basophils Absolute: 0 10*3/uL (ref 0.0–0.2)
Basos: 0 %
EOS (ABSOLUTE): 0 10*3/uL (ref 0.0–0.4)
Eos: 1 %
Hematocrit: 40 % (ref 34.0–46.6)
Hemoglobin: 12.5 g/dL (ref 11.1–15.9)
Immature Grans (Abs): 0 10*3/uL (ref 0.0–0.1)
Immature Granulocytes: 0 %
Lymphocytes Absolute: 1.4 10*3/uL (ref 0.7–3.1)
Lymphs: 32 %
MCH: 27.8 pg (ref 26.6–33.0)
MCHC: 31.3 g/dL — ABNORMAL LOW (ref 31.5–35.7)
MCV: 89 fL (ref 79–97)
Monocytes Absolute: 0.5 10*3/uL (ref 0.1–0.9)
Monocytes: 10 %
Neutrophils Absolute: 2.5 10*3/uL (ref 1.4–7.0)
Neutrophils: 57 %
Platelets: 426 10*3/uL (ref 150–450)
RBC: 4.5 x10E6/uL (ref 3.77–5.28)
RDW: 12.4 % (ref 11.7–15.4)
WBC: 4.5 10*3/uL (ref 3.4–10.8)

## 2023-08-22 LAB — IGG, IGA, IGM
IgA/Immunoglobulin A, Serum: 150 mg/dL (ref 87–352)
IgG (Immunoglobin G), Serum: 992 mg/dL (ref 586–1602)
IgM (Immunoglobulin M), Srm: 141 mg/dL (ref 26–217)

## 2023-08-22 LAB — CD20 B CELLS
% CD19-B Cells: 0.7 % — ABNORMAL LOW (ref 4.6–22.1)
% CD20-B Cells: 0.7 % — ABNORMAL LOW (ref 5.0–22.3)

## 2023-08-23 ENCOUNTER — Encounter: Payer: Self-pay | Admitting: Neurology

## 2023-09-16 ENCOUNTER — Telehealth: Payer: Self-pay | Admitting: *Deleted

## 2023-09-16 NOTE — Telephone Encounter (Signed)
 Gave letter to [facility] since they infuse pt.

## 2024-02-25 ENCOUNTER — Encounter: Payer: Self-pay | Admitting: Neurology

## 2024-02-25 ENCOUNTER — Ambulatory Visit: Admitting: Neurology

## 2024-02-25 VITALS — BP 145/84 | HR 84 | Ht 67.0 in | Wt 216.0 lb

## 2024-02-25 DIAGNOSIS — Z79899 Other long term (current) drug therapy: Secondary | ICD-10-CM | POA: Diagnosis not present

## 2024-02-25 DIAGNOSIS — G35A Relapsing-remitting multiple sclerosis: Secondary | ICD-10-CM | POA: Diagnosis not present

## 2024-02-25 DIAGNOSIS — G360 Neuromyelitis optica [Devic]: Secondary | ICD-10-CM

## 2024-02-25 DIAGNOSIS — G379 Demyelinating disease of central nervous system, unspecified: Secondary | ICD-10-CM

## 2024-02-25 DIAGNOSIS — E559 Vitamin D deficiency, unspecified: Secondary | ICD-10-CM
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
## 2024-02-27 ENCOUNTER — Ambulatory Visit: Payer: Self-pay | Admitting: Neurology

## 2024-02-27 LAB — IGG, IGA, IGM
IgA/Immunoglobulin A, Serum: 128 mg/dL (ref 87–352)
IgG (Immunoglobin G), Serum: 849 mg/dL (ref 586–1602)
IgM (Immunoglobulin M), Srm: 111 mg/dL (ref 26–217)

## 2024-02-27 LAB — VITAMIN D 25 HYDROXY (VIT D DEFICIENCY, FRACTURES): Vit D, 25-Hydroxy: 14.4 ng/mL — ABNORMAL LOW (ref 30.0–100.0)

## 2024-02-27 LAB — CD20 B CELLS
% CD19-B Cells: 0 % — AB (ref 4.6–22.1)
% CD20-B Cells: 0 % — AB (ref 5.0–22.3)

## 2024-02-27 MED ORDER — VITAMIN D (ERGOCALCIFEROL) 1.25 MG (50000 UNIT) PO CAPS: 50000.0000 [IU] | ORAL_CAPSULE | ORAL | 3 refills | Status: AC

## 2024-03-07 ENCOUNTER — Encounter: Payer: Self-pay | Admitting: Neurology

## 2024-03-07 ENCOUNTER — Telehealth: Payer: Self-pay | Admitting: Neurology

## 2024-03-07 NOTE — Telephone Encounter (Signed)
MRI orders sent to [facility] [phone]

## 2024-03-30 ENCOUNTER — Other Ambulatory Visit: Payer: Self-pay

## 2024-03-30 ENCOUNTER — Inpatient Hospital Stay: Admission: RE | Admit: 2024-03-30 | Payer: Self-pay | Source: Ambulatory Visit

## 2024-04-16 ENCOUNTER — Inpatient Hospital Stay: Admission: RE | Admit: 2024-04-16 | Discharge: 2024-04-16 | Attending: Neurology | Admitting: Neurology

## 2024-04-16 DIAGNOSIS — G35A Relapsing-remitting multiple sclerosis: Secondary | ICD-10-CM | POA: Diagnosis not present

## 2024-09-29 ENCOUNTER — Ambulatory Visit: Admitting: Neurology
